# Patient Record
Sex: Female | Born: 1935 | Race: White | Hispanic: No | Smoking: Former smoker
Health system: Southern US, Community
[De-identification: ages and names within clinical notes are randomized; demographics above are authoritative.]

## PROBLEM LIST (undated history)

## (undated) DIAGNOSIS — T4145XA Adverse effect of unspecified anesthetic, initial encounter: Secondary | ICD-10-CM

## (undated) DIAGNOSIS — Z9889 Other specified postprocedural states: Secondary | ICD-10-CM

## (undated) DIAGNOSIS — Z87891 Personal history of nicotine dependence: Secondary | ICD-10-CM

## (undated) DIAGNOSIS — T7840XA Allergy, unspecified, initial encounter: Secondary | ICD-10-CM

## (undated) DIAGNOSIS — T8859XA Other complications of anesthesia, initial encounter: Secondary | ICD-10-CM

## (undated) DIAGNOSIS — M1990 Unspecified osteoarthritis, unspecified site: Secondary | ICD-10-CM

## (undated) DIAGNOSIS — R112 Nausea with vomiting, unspecified: Secondary | ICD-10-CM

## (undated) DIAGNOSIS — R519 Headache, unspecified: Secondary | ICD-10-CM

## (undated) DIAGNOSIS — I10 Essential (primary) hypertension: Secondary | ICD-10-CM

## (undated) DIAGNOSIS — Z1211 Encounter for screening for malignant neoplasm of colon: Secondary | ICD-10-CM

## (undated) DIAGNOSIS — Z87440 Personal history of urinary (tract) infections: Secondary | ICD-10-CM

## (undated) DIAGNOSIS — C801 Malignant (primary) neoplasm, unspecified: Secondary | ICD-10-CM

## (undated) DIAGNOSIS — Z1239 Encounter for other screening for malignant neoplasm of breast: Secondary | ICD-10-CM

## (undated) HISTORY — DX: Essential (primary) hypertension: I10

## (undated) HISTORY — DX: Personal history of nicotine dependence: Z87.891

## (undated) HISTORY — PX: COLONOSCOPY W/ POLYPECTOMY: SHX1380

## (undated) HISTORY — PX: ABDOMINAL HYSTERECTOMY: SHX81

## (undated) HISTORY — DX: Encounter for screening for malignant neoplasm of colon: Z12.11

## (undated) HISTORY — DX: Allergy, unspecified, initial encounter: T78.40XA

## (undated) HISTORY — DX: Encounter for other screening for malignant neoplasm of breast: Z12.39

## (undated) HISTORY — PX: JOINT REPLACEMENT: SHX530

## (undated) HISTORY — DX: Personal history of urinary (tract) infections: Z87.440

## (undated) HISTORY — PX: BELPHAROPTOSIS REPAIR: SHX369

## (undated) HISTORY — DX: Unspecified osteoarthritis, unspecified site: M19.90

---

## 2005-01-10 ENCOUNTER — Ambulatory Visit: Payer: Self-pay | Admitting: General Surgery

## 2005-01-13 ENCOUNTER — Ambulatory Visit: Payer: Self-pay | Admitting: General Surgery

## 2007-02-16 ENCOUNTER — Ambulatory Visit: Payer: Self-pay | Admitting: Unknown Physician Specialty

## 2007-02-16 ENCOUNTER — Other Ambulatory Visit: Payer: Self-pay

## 2007-02-20 ENCOUNTER — Inpatient Hospital Stay: Payer: Self-pay | Admitting: Unknown Physician Specialty

## 2007-03-15 ENCOUNTER — Ambulatory Visit: Payer: Self-pay | Admitting: Family Medicine

## 2010-04-04 DIAGNOSIS — Z87440 Personal history of urinary (tract) infections: Secondary | ICD-10-CM

## 2010-04-04 HISTORY — PX: HEMORRHOID SURGERY: SHX153

## 2010-04-04 HISTORY — DX: Personal history of urinary (tract) infections: Z87.440

## 2011-04-05 DIAGNOSIS — Z1211 Encounter for screening for malignant neoplasm of colon: Secondary | ICD-10-CM

## 2011-04-05 HISTORY — DX: Encounter for screening for malignant neoplasm of colon: Z12.11

## 2011-04-08 ENCOUNTER — Ambulatory Visit: Payer: Self-pay | Admitting: General Surgery

## 2011-04-11 LAB — PATHOLOGY REPORT

## 2011-05-20 ENCOUNTER — Ambulatory Visit: Payer: Self-pay | Admitting: Unknown Physician Specialty

## 2011-06-02 ENCOUNTER — Ambulatory Visit: Payer: Self-pay | Admitting: Unknown Physician Specialty

## 2011-06-02 DIAGNOSIS — I10 Essential (primary) hypertension: Secondary | ICD-10-CM

## 2011-06-02 LAB — URINALYSIS, COMPLETE
Bilirubin,UR: NEGATIVE
Blood: NEGATIVE
Ketone: NEGATIVE
Ph: 5 (ref 4.5–8.0)
Specific Gravity: 1.02 (ref 1.003–1.030)
Squamous Epithelial: 1
WBC UR: 2 /HPF (ref 0–5)

## 2011-06-02 LAB — CBC
HGB: 12.8 g/dL (ref 12.0–16.0)
MCH: 31.6 pg (ref 26.0–34.0)
MCV: 95 fL (ref 80–100)
RBC: 4.05 10*6/uL (ref 3.80–5.20)
RDW: 12.2 % (ref 11.5–14.5)
WBC: 6.2 10*3/uL (ref 3.6–11.0)

## 2011-06-06 ENCOUNTER — Ambulatory Visit: Payer: Self-pay | Admitting: Unknown Physician Specialty

## 2011-09-12 ENCOUNTER — Ambulatory Visit: Payer: Self-pay | Admitting: Unknown Physician Specialty

## 2012-10-17 ENCOUNTER — Encounter: Payer: Self-pay | Admitting: *Deleted

## 2013-04-24 ENCOUNTER — Ambulatory Visit: Payer: Self-pay | Admitting: Internal Medicine

## 2013-07-22 ENCOUNTER — Ambulatory Visit: Payer: Self-pay | Admitting: Internal Medicine

## 2014-02-20 ENCOUNTER — Ambulatory Visit: Payer: Self-pay | Admitting: Internal Medicine

## 2014-07-27 NOTE — Op Note (Signed)
PATIENT NAME:  Chloe Rhodes, Chloe Rhodes MR#:  [id number] DATE OF BIRTH:  [birth year]  DATE OF PROCEDURE:  06/06/2011  PREOPERATIVE DIAGNOSIS: Torn medial meniscus, right knee, with probable medial compartment chondral lesions.   POSTOPERATIVE DIAGNOSIS: Torn medial meniscus, right knee, with medial compartment chondral lesions.   PROCEDURE PERFORMED: Arthroscopic partial medial meniscectomy plus debridement and Coblation of the medial femoral chondral lesion.   SURGEON: Kathrene Alu., MD  ANESTHESIA: General.   HISTORY: The patient had a long history of right knee pain. Her plain films had actually been fairly normal. Ultimately a MRI was obtained. It revealed a torn medial meniscus and some degenerative changes referable to the medial compartment, right knee.   The patient was ultimately brought in for arthroscopy of the right knee because of persistent symptoms despite conservative treatment.   DESCRIPTION OF PROCEDURE: The patient was taken to the Operating Room where satisfactory general anesthesia was achieved.   A tourniquet and legholder were applied to the right thigh and the left lower extremity was supported with a well leg support. The right knee was prepped and draped in the usual fashion for a procedure about the knee.   An inflow cannula was introduced superomedially. A moderate amount of clear synovial fluid was obtained from the knee joint. The knee was distended with lactated Ringer's. We used the Mitek fluid pump to facilitate knee extension.   The scope was introduced through an inferolateral puncture wound and a probe was brought in through an inferomedial puncture wound. Inspection of the medial compartment revealed that the patient had a degenerative type tear of the posterior horn of the medial meniscus. There was grade III, almost grade IV chondral changes in the mid weight-bearing portion of the medial femoral condyle.   I went ahead and resected the torn medial meniscus  using a combination of motorized resector and basket biter. The remaining rim was contoured with an angled ArthroCare thermal wand. I debrided the medial femoral chondral lesion with a Turbo Whisker and then coblated the lesion with an ArthroCare Paragon wand. I then inspected the intercondylar notch. The anterior cruciate ligament appeared to be intact. Inspection of the lateral compartment revealed a little superficial fraying of the mid weight-bearing portion of the lateral femoral condyle and the midportion of the lateral tibial plateau. I used a Turbo Whisker to debride these frayed areas.   Inspection of the trochlear groove revealed that there were some mild degenerative changes. The retropatellar surface was viewed primarily from the superomedial portal. The retropatellar surface exhibited some grade II chondral changes with possibly grade III in a few areas. There was a superior patella osteophyte noted. I debrided this with a large round bur.   The joint was then decompressed through the cannulas. I removed the cannulas. The puncture wounds were closed with 3-0 nylon in vertical mattress fashion. Several milliliters of 0.5% Marcaine without epinephrine was injected about each puncture wound.   Betadine was applied to the wounds followed by a sterile dressing and an ice pack.   The patient was then awakened and transferred to her stretcher bed. She was taken to the Recovery Room in satisfactory condition. Blood loss was negligible.   Incidentally, the tourniquet was not inflated during the course of the procedure.  ____________________________ Kathrene Alu., MD hbk:slb D: 06/06/2011 16:46:54 ET    T: 06/06/2011 17:47:34 ET       JOB#: [id number] cc: Kathrene Alu., MD, <Dictator> Kathrene Alu MD  ELECTRONICALLY SIGNED 06/13/2011 15:29

## 2014-08-18 DIAGNOSIS — Z85828 Personal history of other malignant neoplasm of skin: Secondary | ICD-10-CM | POA: Insufficient documentation

## 2015-08-26 ENCOUNTER — Encounter: Payer: Self-pay | Admitting: *Deleted

## 2015-09-01 ENCOUNTER — Ambulatory Visit
Admission: RE | Admit: 2015-09-01 | Discharge: 2015-09-01 | Disposition: A | Discharge status: Home / Self Care | Payer: Medicare Other | Source: Ambulatory Visit | Attending: Ophthalmology | Admitting: Ophthalmology

## 2015-09-01 ENCOUNTER — Ambulatory Visit: Payer: Medicare Other | Admitting: Anesthesiology

## 2015-09-01 ENCOUNTER — Encounter
Admission: RE | Disposition: A | Discharge status: Home / Self Care | Payer: Self-pay | Source: Ambulatory Visit | Attending: Ophthalmology

## 2015-09-01 DIAGNOSIS — Z79899 Other long term (current) drug therapy: Secondary | ICD-10-CM | POA: Insufficient documentation

## 2015-09-01 DIAGNOSIS — E7800 Pure hypercholesterolemia, unspecified: Secondary | ICD-10-CM | POA: Insufficient documentation

## 2015-09-01 DIAGNOSIS — M1990 Unspecified osteoarthritis, unspecified site: Secondary | ICD-10-CM | POA: Insufficient documentation

## 2015-09-01 DIAGNOSIS — I10 Essential (primary) hypertension: Secondary | ICD-10-CM | POA: Diagnosis not present

## 2015-09-01 DIAGNOSIS — J449 Chronic obstructive pulmonary disease, unspecified: Secondary | ICD-10-CM | POA: Insufficient documentation

## 2015-09-01 DIAGNOSIS — Z96659 Presence of unspecified artificial knee joint: Secondary | ICD-10-CM | POA: Insufficient documentation

## 2015-09-01 DIAGNOSIS — Z87891 Personal history of nicotine dependence: Secondary | ICD-10-CM | POA: Diagnosis not present

## 2015-09-01 DIAGNOSIS — H2512 Age-related nuclear cataract, left eye: Secondary | ICD-10-CM | POA: Diagnosis present

## 2015-09-01 DIAGNOSIS — Z90710 Acquired absence of both cervix and uterus: Secondary | ICD-10-CM | POA: Diagnosis not present

## 2015-09-01 DIAGNOSIS — Z96649 Presence of unspecified artificial hip joint: Secondary | ICD-10-CM | POA: Diagnosis not present

## 2015-09-01 DIAGNOSIS — Z86010 Personal history of colonic polyps: Secondary | ICD-10-CM | POA: Insufficient documentation

## 2015-09-01 DIAGNOSIS — Z885 Allergy status to narcotic agent status: Secondary | ICD-10-CM | POA: Diagnosis not present

## 2015-09-01 HISTORY — DX: Adverse effect of unspecified anesthetic, initial encounter: T41.45XA

## 2015-09-01 HISTORY — PX: CATARACT EXTRACTION W/PHACO: SHX586

## 2015-09-01 HISTORY — DX: Nausea with vomiting, unspecified: R11.2

## 2015-09-01 HISTORY — DX: Nausea with vomiting, unspecified: Z98.890

## 2015-09-01 HISTORY — DX: Other complications of anesthesia, initial encounter: T88.59XA

## 2015-09-01 SURGERY — PHACOEMULSIFICATION, CATARACT, WITH IOL INSERTION
Anesthesia: Monitor Anesthesia Care | Site: Eye | Laterality: Left | Wound class: Clean

## 2015-09-01 MED ORDER — TETRACAINE HCL 0.5 % OP SOLN
1.0000 [drp] | Freq: Once | OPHTHALMIC | Status: AC
  Administered 2015-09-01: 1 [drp] via OPHTHALMIC

## 2015-09-01 MED ORDER — CEFUROXIME OPHTHALMIC INJECTION 1 MG/0.1 ML
INJECTION | OPHTHALMIC | Status: AC
  Filled 2015-09-01: qty 0.1

## 2015-09-01 MED ORDER — ARMC OPHTHALMIC DILATING GEL
1.0000 "application " | OPHTHALMIC | Status: AC | PRN
  Administered 2015-09-01 (×2): 1 via OPHTHALMIC

## 2015-09-01 MED ORDER — POVIDONE-IODINE 5 % OP SOLN
1.0000 "application " | Freq: Once | OPHTHALMIC | Status: AC
  Administered 2015-09-01: 1 via OPHTHALMIC

## 2015-09-01 MED ORDER — MOXIFLOXACIN HCL 0.5 % OP SOLN: 1.0000 [drp] | OPHTHALMIC | Status: DC | PRN

## 2015-09-01 MED ORDER — CEFUROXIME OPHTHALMIC INJECTION 1 MG/0.1 ML
INJECTION | OPHTHALMIC | Status: DC | PRN
  Administered 2015-09-01: 0.1 mL via INTRACAMERAL

## 2015-09-01 MED ORDER — FENTANYL CITRATE (PF) 100 MCG/2ML IJ SOLN
INTRAMUSCULAR | Status: DC | PRN
  Administered 2015-09-01: 25 ug via INTRAVENOUS

## 2015-09-01 MED ORDER — EPINEPHRINE HCL 1 MG/ML IJ SOLN
INTRAMUSCULAR | Status: AC
  Filled 2015-09-01: qty 1

## 2015-09-01 MED ORDER — TETRACAINE HCL 0.5 % OP SOLN
OPHTHALMIC | Status: AC
  Administered 2015-09-01: 1 [drp] via OPHTHALMIC
  Filled 2015-09-01: qty 2

## 2015-09-01 MED ORDER — POVIDONE-IODINE 5 % OP SOLN
OPHTHALMIC | Status: AC
  Administered 2015-09-01: 1 via OPHTHALMIC
  Filled 2015-09-01: qty 30

## 2015-09-01 MED ORDER — EPINEPHRINE HCL 1 MG/ML IJ SOLN
INTRAOCULAR | Status: DC | PRN
  Administered 2015-09-01: 09:00:00 via OPHTHALMIC

## 2015-09-01 MED ORDER — SODIUM CHLORIDE 0.9 % IV SOLN
INTRAVENOUS | Status: DC
  Administered 2015-09-01 (×2): via INTRAVENOUS

## 2015-09-01 MED ORDER — MOXIFLOXACIN HCL 0.5 % OP SOLN
OPHTHALMIC | Status: DC | PRN
  Administered 2015-09-01: 1 [drp] via OPHTHALMIC

## 2015-09-01 MED ORDER — CARBACHOL 0.01 % IO SOLN
INTRAOCULAR | Status: DC | PRN
  Administered 2015-09-01: 0.5 mL via INTRAOCULAR

## 2015-09-01 MED ORDER — NA CHONDROIT SULF-NA HYALURON 40-17 MG/ML IO SOLN
INTRAOCULAR | Status: DC | PRN
  Administered 2015-09-01: 1 mL via INTRAOCULAR

## 2015-09-01 MED ORDER — MOXIFLOXACIN HCL 0.5 % OP SOLN
OPHTHALMIC | Status: AC
  Filled 2015-09-01: qty 3

## 2015-09-01 MED ORDER — NA CHONDROIT SULF-NA HYALURON 40-17 MG/ML IO SOLN
INTRAOCULAR | Status: AC
  Filled 2015-09-01: qty 1

## 2015-09-01 MED ORDER — ONDANSETRON HCL 4 MG/2ML IJ SOLN
INTRAMUSCULAR | Status: DC | PRN
  Administered 2015-09-01: 4 mg via INTRAVENOUS

## 2015-09-01 SURGICAL SUPPLY — 21 items
CANNULA ANT/CHMB 27GA (MISCELLANEOUS) ×2 IMPLANT
CUP MEDICINE 2OZ PLAST GRAD ST (MISCELLANEOUS) ×2 IMPLANT
GLOVE BIO SURGEON STRL SZ8 (GLOVE) ×2 IMPLANT
GLOVE BIOGEL M 6.5 STRL (GLOVE) ×2 IMPLANT
GLOVE SURG LX 8.0 MICRO (GLOVE) ×1
GLOVE SURG LX STRL 8.0 MICRO (GLOVE) ×1 IMPLANT
GOWN STRL REUS W/ TWL LRG LVL3 (GOWN DISPOSABLE) ×2 IMPLANT
GOWN STRL REUS W/TWL LRG LVL3 (GOWN DISPOSABLE) ×2
LENS IOL TECNIS ITEC 20.5 (Intraocular Lens) ×2 IMPLANT
PACK CATARACT (MISCELLANEOUS) ×2 IMPLANT
PACK CATARACT BRASINGTON LX (MISCELLANEOUS) ×2 IMPLANT
PACK EYE AFTER SURG (MISCELLANEOUS) ×2 IMPLANT
SOL BSS BAG (MISCELLANEOUS) ×2
SOL PREP PVP 2OZ (MISCELLANEOUS) ×2
SOLUTION BSS BAG (MISCELLANEOUS) ×1 IMPLANT
SOLUTION PREP PVP 2OZ (MISCELLANEOUS) ×1 IMPLANT
SYR 3ML LL SCALE MARK (SYRINGE) ×2 IMPLANT
SYR 5ML LL (SYRINGE) ×2 IMPLANT
SYR TB 1ML 27GX1/2 LL (SYRINGE) ×2 IMPLANT
WATER STERILE IRR 1000ML POUR (IV SOLUTION) ×2 IMPLANT
WIPE NON LINTING 3.25X3.25 (MISCELLANEOUS) ×2 IMPLANT

## 2015-09-01 NOTE — Anesthesia Postprocedure Evaluation (Signed)
Anesthesia Post Note  Patient: Chloe Rhodes  Procedure(s) Performed: Procedure(s) (LRB): CATARACT EXTRACTION PHACO AND INTRAOCULAR LENS PLACEMENT (IOC) (Left)  Patient location during evaluation: PACU Anesthesia Type: MAC Level of consciousness: awake and alert Pain management: pain level controlled Vital Signs Assessment: post-procedure vital signs reviewed and stable Respiratory status: spontaneous breathing, nonlabored ventilation, respiratory function stable and patient connected to nasal cannula oxygen Cardiovascular status: stable and blood pressure returned to baseline Anesthetic complications: no    Last Vitals:  Filed Vitals:   09/01/15 0809 09/01/15 0943  BP: 125/65 125/51  Pulse: 84 71  Temp: 36.4 C 36.5 C  Resp: 16 16    Last Pain: There were no vitals filed for this visit.               Precious Haws Jabarri Stefanelli

## 2015-09-01 NOTE — Anesthesia Preprocedure Evaluation (Signed)
Anesthesia Evaluation  Patient identified by MRN, date of birth, ID band Patient awake    Reviewed: Allergy & Precautions, H&P , NPO status , Patient's Chart, lab work & pertinent test results  History of Anesthesia Complications (+) PONV and history of anesthetic complications  Airway Mallampati: III  TM Distance: <3 FB Neck ROM: limited    Dental  (+) Poor Dentition, Chipped, Missing, Upper Dentures, Lower Dentures   Pulmonary neg shortness of breath, COPD, former smoker,    Pulmonary exam normal breath sounds clear to auscultation       Cardiovascular Exercise Tolerance: Good hypertension, (-) angina(-) Past MI and (-) DOE Normal cardiovascular exam Rhythm:regular Rate:Normal     Neuro/Psych negative neurological ROS  negative psych ROS   GI/Hepatic negative GI ROS, Neg liver ROS, neg GERD  ,  Endo/Other  negative endocrine ROS  Renal/GU negative Renal ROS  negative genitourinary   Musculoskeletal  (+) Arthritis ,   Abdominal   Peds  Hematology negative hematology ROS (+)   Anesthesia Other Findings Past Medical History:   Hypertension                                                 Personal history of tobacco use, presenting ha*              Arthritis                                                    Allergy                                                        Comment:codeine   H/O cystitis                                    2012         Breast screening, unspecified                                Special screening for malignant neoplasms, col* 0000000         Complication of anesthesia                                   PONV (postoperative nausea and vomiting)                    Past Surgical History:   HEMORRHOID SURGERY                               2012           Comment:about 20 years ago   COLONOSCOPY W/ POLYPECTOMY                       2006,2013  Comment:Dr. Sankar-04/08/11-a few sessile polyps  were               found in the distal sigmoid colon,31mm in size;               a benign appearing sessile polyp(17mm) was found              in the rectum. Pathology-benign   ABDOMINAL HYSTERECTOMY                                        JOINT REPLACEMENT                                               Comment:total hip/ partial knee  BMI    Body Mass Index   22.87 kg/m 2      Reproductive/Obstetrics negative OB ROS                             Anesthesia Physical Anesthesia Plan  ASA: III  Anesthesia Plan: MAC   Post-op Pain Management:    Induction:   Airway Management Planned:   Additional Equipment:   Intra-op Plan:   Post-operative Plan:   Informed Consent: I have reviewed the patients History and Physical, chart, labs and discussed the procedure including the risks, benefits and alternatives for the proposed anesthesia with the patient or authorized representative who has indicated his/her understanding and acceptance.   Dental Advisory Given  Plan Discussed with: Anesthesiologist, CRNA and Surgeon  Anesthesia Plan Comments:         Anesthesia Quick Evaluation

## 2015-09-01 NOTE — Op Note (Signed)
PREOPERATIVE DIAGNOSIS:  Nuclear sclerotic cataract of the left eye.   POSTOPERATIVE DIAGNOSIS:  NUCLEAR SCLEROTIC CATARACT LEFT EYE   OPERATIVE PROCEDURE:  Procedure(s): CATARACT EXTRACTION PHACO AND INTRAOCULAR LENS PLACEMENT (IOC)   SURGEON:  Birder Robson, MD.   ANESTHESIA:   Anesthesiologist: Andria Frames, MD CRNA: Jenetta Downer, CRNA  1.      Managed anesthesia care. 2.      Topical tetracaine drops followed by 2% Xylocaine jelly applied in the preoperative holding area.   COMPLICATIONS:  None.   TECHNIQUE:   Stop and chop   DESCRIPTION OF PROCEDURE:  The patient was examined and consented in the preoperative holding area where the aforementioned topical anesthesia was applied to the left eye and then brought back to the Operating Room where the left eye was prepped and draped in the usual sterile ophthalmic fashion and a lid speculum was placed. A paracentesis was created with the side port blade and the anterior chamber was filled with viscoelastic. A near clear corneal incision was performed with the steel keratome. A continuous curvilinear capsulorrhexis was performed with a cystotome followed by the capsulorrhexis forceps. Hydrodissection and hydrodelineation were carried out with BSS on a blunt cannula. The lens was removed in a stop and chop  technique and the remaining cortical material was removed with the irrigation-aspiration handpiece. The capsular bag was inflated with viscoelastic and the Technis ZCB00 lens was placed in the capsular bag without complication. The remaining viscoelastic was removed from the eye with the irrigation-aspiration handpiece. The wounds were hydrated. The anterior chamber was flushed with Miostat and the eye was inflated to physiologic pressure. 0.1 mL of cefuroxime concentration 10 mg/mL was placed in the anterior chamber. The wounds were found to be water tight. The eye was dressed with Vigamox. The patient was given protective  glasses to wear throughout the day and a shield with which to sleep tonight. The patient was also given drops with which to begin a drop regimen today and will follow-up with me in one day.  Implant Name Type Inv. Item Serial No. Manufacturer Lot No. LRB No. Used  LENS IOL DIOP 20.5 - [id number] Intraocular Lens LENS IOL DIOP 20.5 [id number]   Left 1   Procedure(s) with comments: CATARACT EXTRACTION PHACO AND INTRAOCULAR LENS PLACEMENT (IOC) (Left) - [place] 00:44 AP% 19.5 CDE 8.69 Fluide pack lot # [id number]  Electronically signed: [place] 09/01/2015 9:38 AM

## 2015-09-01 NOTE — Transfer of Care (Signed)
Immediate Anesthesia Transfer of Care Note  Patient: Chloe Rhodes  Procedure(s) Performed: Procedure(s) with comments: CATARACT EXTRACTION PHACO AND INTRAOCULAR LENS PLACEMENT (IOC) (Left) - [place] 00:44 AP% 19.5 CDE 8.69 Fluide pack lot # [id number]  Patient Location: PACU  Anesthesia Type:MAC  Level of Consciousness: awake, alert  and oriented  Airway & Oxygen Therapy: Patient Spontanous Breathing  Post-op Assessment: Report given to RN  Post vital signs: Reviewed and stable  Last Vitals:  Filed Vitals:   09/01/15 0809  BP: 125/65  Pulse: 84  Temp: 36.4 C  Resp: 16    Last Pain: There were no vitals filed for this visit.       Complications: No apparent anesthesia complications

## 2015-09-01 NOTE — H&P (Signed)
  All labs reviewed. Abnormal studies sent to patients PCP when indicated.  Previous H&P reviewed, patient examined, there are NO CHANGES.  Chloe Rhodes LOUIS5/30/20179:14 AM

## 2015-09-01 NOTE — Discharge Instructions (Signed)
Eye Surgery Discharge Instructions  Expect mild scratchy sensation or mild soreness. DO NOT RUB YOUR EYE!  The day of surgery:  Minimal physical activity, but bed rest is not required  No reading, computer work, or close hand work  No bending, lifting, or straining.  May watch TV  For 24 hours:  No driving, legal decisions, or alcoholic beverages  Safety precautions  Eat anything you prefer: It is better to start with liquids, then soup then solid foods.  _____ Eye patch should be worn until postoperative exam tomorrow.  ____ Solar shield eyeglasses should be worn for comfort in the sunlight/patch while sleeping  Resume all regular medications including aspirin or Coumadin if these were discontinued prior to surgery. You may shower, bathe, shave, or wash your hair. Tylenol may be taken for mild discomfort.  Call your doctor if you experience significant pain, nausea, or vomiting, fever > 101 or other signs of infection. [phone] or [phone] Specific instructions:  Follow-up Information    Follow up with Tim Lair, MD On 09/02/2015.   Specialty:  Ophthalmology   Why:  9:00   Contact information:   [address] [place] [address] [phone]

## 2015-09-22 ENCOUNTER — Ambulatory Visit
Admission: RE | Admit: 2015-09-22 | Discharge: 2015-09-22 | Disposition: A | Discharge status: Home / Self Care | Payer: Medicare Other | Source: Ambulatory Visit | Attending: Ophthalmology | Admitting: Ophthalmology

## 2015-09-22 ENCOUNTER — Encounter: Payer: Self-pay | Admitting: *Deleted

## 2015-09-22 ENCOUNTER — Ambulatory Visit: Payer: Medicare Other | Admitting: Anesthesiology

## 2015-09-22 ENCOUNTER — Encounter
Admission: RE | Disposition: A | Discharge status: Home / Self Care | Payer: Self-pay | Source: Ambulatory Visit | Attending: Ophthalmology

## 2015-09-22 DIAGNOSIS — H2511 Age-related nuclear cataract, right eye: Secondary | ICD-10-CM | POA: Diagnosis not present

## 2015-09-22 HISTORY — PX: CATARACT EXTRACTION W/PHACO: SHX586

## 2015-09-22 SURGERY — PHACOEMULSIFICATION, CATARACT, WITH IOL INSERTION
Anesthesia: Monitor Anesthesia Care | Site: Eye | Laterality: Right | Wound class: Clean

## 2015-09-22 MED ORDER — SODIUM CHLORIDE 0.9 % IV SOLN
INTRAVENOUS | Status: DC
  Administered 2015-09-22: 75 mL/h via INTRAVENOUS

## 2015-09-22 MED ORDER — NA CHONDROIT SULF-NA HYALURON 40-17 MG/ML IO SOLN
INTRAOCULAR | Status: AC
Start: 2015-09-22 — End: 2015-09-22
  Filled 2015-09-22: qty 1

## 2015-09-22 MED ORDER — TETRACAINE HCL 0.5 % OP SOLN
1.0000 [drp] | Freq: Once | OPHTHALMIC | Status: AC
  Administered 2015-09-22: 1 [drp] via OPHTHALMIC

## 2015-09-22 MED ORDER — MOXIFLOXACIN HCL 0.5 % OP SOLN
OPHTHALMIC | Status: AC
  Filled 2015-09-22: qty 3

## 2015-09-22 MED ORDER — ARMC OPHTHALMIC DILATING GEL
1.0000 "application " | OPHTHALMIC | Status: DC | PRN
  Administered 2015-09-22: 1 via OPHTHALMIC

## 2015-09-22 MED ORDER — ONDANSETRON HCL 4 MG/2ML IJ SOLN: 4.0000 mg | Freq: Once | INTRAMUSCULAR | Status: DC | PRN

## 2015-09-22 MED ORDER — MOXIFLOXACIN HCL 0.5 % OP SOLN: 1.0000 [drp] | OPHTHALMIC | Status: DC | PRN

## 2015-09-22 MED ORDER — ARMC OPHTHALMIC DILATING GEL
OPHTHALMIC | Status: AC
  Administered 2015-09-22: 1 via OPHTHALMIC
  Filled 2015-09-22: qty 0.25

## 2015-09-22 MED ORDER — POVIDONE-IODINE 5 % OP SOLN
1.0000 "application " | Freq: Once | OPHTHALMIC | Status: AC
  Administered 2015-09-22: 1 via OPHTHALMIC

## 2015-09-22 MED ORDER — CEFUROXIME OPHTHALMIC INJECTION 1 MG/0.1 ML
INJECTION | OPHTHALMIC | Status: AC
  Filled 2015-09-22: qty 0.1

## 2015-09-22 MED ORDER — CEFUROXIME OPHTHALMIC INJECTION 1 MG/0.1 ML
INJECTION | OPHTHALMIC | Status: DC | PRN
  Administered 2015-09-22: .1 mL via INTRACAMERAL

## 2015-09-22 MED ORDER — TETRACAINE HCL 0.5 % OP SOLN
OPHTHALMIC | Status: AC
  Administered 2015-09-22: 1 [drp] via OPHTHALMIC
  Filled 2015-09-22: qty 2

## 2015-09-22 MED ORDER — POVIDONE-IODINE 5 % OP SOLN
OPHTHALMIC | Status: AC
  Administered 2015-09-22: 1 via OPHTHALMIC
  Filled 2015-09-22: qty 30

## 2015-09-22 MED ORDER — NA CHONDROIT SULF-NA HYALURON 40-17 MG/ML IO SOLN
INTRAOCULAR | Status: DC | PRN
  Administered 2015-09-22: 1 mL via INTRAOCULAR

## 2015-09-22 MED ORDER — MIDAZOLAM HCL 2 MG/2ML IJ SOLN
INTRAMUSCULAR | Status: DC | PRN
  Administered 2015-09-22: 1 mg via INTRAVENOUS
  Administered 2015-09-22: .5 mg via INTRAVENOUS
  Administered 2015-09-22: 1 mg via INTRAVENOUS

## 2015-09-22 MED ORDER — FENTANYL CITRATE (PF) 100 MCG/2ML IJ SOLN: 25.0000 ug | INTRAMUSCULAR | Status: DC | PRN

## 2015-09-22 MED ORDER — EPINEPHRINE HCL 1 MG/ML IJ SOLN
INTRAMUSCULAR | Status: AC
  Filled 2015-09-22: qty 1

## 2015-09-22 MED ORDER — CARBACHOL 0.01 % IO SOLN
INTRAOCULAR | Status: DC | PRN
Start: 2015-09-22 — End: 2015-09-22
  Administered 2015-09-22: 0.5 mL via INTRAOCULAR

## 2015-09-22 MED ORDER — MOXIFLOXACIN HCL 0.5 % OP SOLN
OPHTHALMIC | Status: DC | PRN
  Administered 2015-09-22: 1 [drp] via OPHTHALMIC

## 2015-09-22 MED ORDER — EPINEPHRINE HCL 1 MG/ML IJ SOLN
INTRAMUSCULAR | Status: DC | PRN
  Administered 2015-09-22: 1 mL via OPHTHALMIC

## 2015-09-22 SURGICAL SUPPLY — 21 items
CANNULA ANT/CHMB 27GA (MISCELLANEOUS) ×2 IMPLANT
CUP MEDICINE 2OZ PLAST GRAD ST (MISCELLANEOUS) ×2 IMPLANT
GLOVE BIO SURGEON STRL SZ8 (GLOVE) ×2 IMPLANT
GLOVE BIOGEL M 6.5 STRL (GLOVE) ×2 IMPLANT
GLOVE SURG LX 8.0 MICRO (GLOVE) ×1
GLOVE SURG LX STRL 8.0 MICRO (GLOVE) ×1 IMPLANT
GOWN STRL REUS W/ TWL LRG LVL3 (GOWN DISPOSABLE) ×2 IMPLANT
GOWN STRL REUS W/TWL LRG LVL3 (GOWN DISPOSABLE) ×2
LENS IOL TECNIS ITEC 21.0 (Intraocular Lens) ×2 IMPLANT
PACK CATARACT (MISCELLANEOUS) ×2 IMPLANT
PACK CATARACT BRASINGTON LX (MISCELLANEOUS) ×2 IMPLANT
PACK EYE AFTER SURG (MISCELLANEOUS) ×2 IMPLANT
SOL BSS BAG (MISCELLANEOUS) ×2
SOL PREP PVP 2OZ (MISCELLANEOUS) ×2
SOLUTION BSS BAG (MISCELLANEOUS) ×1 IMPLANT
SOLUTION PREP PVP 2OZ (MISCELLANEOUS) ×1 IMPLANT
SYR 3ML LL SCALE MARK (SYRINGE) ×2 IMPLANT
SYR 5ML LL (SYRINGE) ×2 IMPLANT
SYR TB 1ML 27GX1/2 LL (SYRINGE) ×2 IMPLANT
WATER STERILE IRR 1000ML POUR (IV SOLUTION) ×2 IMPLANT
WIPE NON LINTING 3.25X3.25 (MISCELLANEOUS) ×2 IMPLANT

## 2015-09-22 NOTE — Discharge Instructions (Signed)
Eye Surgery Discharge Instructions  Expect mild scratchy sensation or mild soreness. DO NOT RUB YOUR EYE!  The day of surgery:  Minimal physical activity, but bed rest is not required  No reading, computer work, or close hand work  No bending, lifting, or straining.  May watch TV  For 24 hours:  No driving, legal decisions, or alcoholic beverages  Safety precautions  Eat anything you prefer: It is better to start with liquids, then soup then solid foods.  _____ Eye patch should be worn until postoperative exam tomorrow.  ____ Solar shield eyeglasses should be worn for comfort in the sunlight/patch while sleeping  Resume all regular medications including aspirin or Coumadin if these were discontinued prior to surgery. You may shower, bathe, shave, or wash your hair. Tylenol may be taken for mild discomfort.  Call your doctor if you experience significant pain, nausea, or vomiting, fever > 101 or other signs of infection. [phone] or [phone] Specific instructions:  Follow-up Information    Follow up with Tim Lair, MD On 09/23/2015.   Specialty:  Ophthalmology   Why:  [address] information:   [address] [place] [address] [phone]

## 2015-09-22 NOTE — Anesthesia Postprocedure Evaluation (Signed)
Anesthesia Post Note  Patient: Chloe Rhodes  Procedure(s) Performed: Procedure(s) (LRB): CATARACT EXTRACTION PHACO AND INTRAOCULAR LENS PLACEMENT (IOC) (Right)  Patient location during evaluation: PACU Level of consciousness: awake and alert Pain management: pain level controlled Vital Signs Assessment: post-procedure vital signs reviewed and stable Respiratory status: spontaneous breathing Cardiovascular status: blood pressure returned to baseline and stable Postop Assessment: no headache and no backache Anesthetic complications: no    Last Vitals:  Filed Vitals:   09/22/15 0951  BP: 142/60  Pulse: 82  Temp: 35.7 C  Resp: 16    Last Pain:  Filed Vitals:   09/22/15 1006  PainSc: 0-No pain                 Adriella Essex C

## 2015-09-22 NOTE — Anesthesia Preprocedure Evaluation (Signed)
Anesthesia Evaluation  Patient identified by MRN, date of birth, ID band Patient awake    Reviewed: Allergy & Precautions, NPO status , Patient's Chart, lab work & pertinent test results  History of Anesthesia Complications (+) PONV  Airway Mallampati: II       Dental  (+) Upper Dentures, Lower Dentures   Pulmonary COPD, former smoker,    Pulmonary exam normal breath sounds clear to auscultation       Cardiovascular Exercise Tolerance: Good hypertension, Pt. on medications  Rhythm:Regular     Neuro/Psych negative neurological ROS     GI/Hepatic negative GI ROS, Neg liver ROS,   Endo/Other  negative endocrine ROS  Renal/GU negative Renal ROS     Musculoskeletal negative musculoskeletal ROS (+)   Abdominal Normal abdominal exam  (+)   Peds negative pediatric ROS (+)  Hematology negative hematology ROS (+)   Anesthesia Other Findings   Reproductive/Obstetrics                             Anesthesia Physical Anesthesia Plan  ASA: II  Anesthesia Plan: MAC   Post-op Pain Management:    Induction: Intravenous  Airway Management Planned: Natural Airway and Nasal Cannula  Additional Equipment:   Intra-op Plan:   Post-operative Plan:   Informed Consent: I have reviewed the patients History and Physical, chart, labs and discussed the procedure including the risks, benefits and alternatives for the proposed anesthesia with the patient or authorized representative who has indicated his/her understanding and acceptance.     Plan Discussed with: CRNA  Anesthesia Plan Comments:         Anesthesia Quick Evaluation

## 2015-09-22 NOTE — Transfer of Care (Signed)
Immediate Anesthesia Transfer of Care Note  Patient: Chloe Rhodes  Procedure(s) Performed: Procedure(s) with comments: CATARACT EXTRACTION PHACO AND INTRAOCULAR LENS PLACEMENT (IOC) (Right) - [place] 00:59 AP% 19.7 CDE 11.72 fluid pack lot # [id number]  Patient Location: PACU  Anesthesia Type:MAC  Level of Consciousness: awake  Airway & Oxygen Therapy: Patient Spontanous Breathing  Post-op Assessment: Report given to RN  Post vital signs: Reviewed and stable  Last Vitals:  Filed Vitals:   09/22/15 0951  BP: 142/60  Pulse: 82  Temp: 35.7 C  Resp: 16    Last Pain:  Filed Vitals:   09/22/15 1006  PainSc: 0-No pain         Complications: No apparent anesthesia complications

## 2015-09-22 NOTE — H&P (Signed)
  All labs reviewed. Abnormal studies sent to patients PCP when indicated.  Previous H&P reviewed, patient examined, there are NO CHANGES.  Chloe Rhodes

## 2015-09-22 NOTE — Op Note (Signed)
PREOPERATIVE DIAGNOSIS:  Nuclear sclerotic cataract of the right eye.   POSTOPERATIVE DIAGNOSIS: nuclear sclerotic cataract right eye   OPERATIVE PROCEDURE:  Procedure(s): CATARACT EXTRACTION PHACO AND INTRAOCULAR LENS PLACEMENT (IOC)   SURGEON:  Birder Robson, MD.   ANESTHESIA:  Anesthesiologist: Iver Nestle, MD CRNA: Jennette Bill  1.      Managed anesthesia care. 2.      Topical tetracaine drops followed by 2% Xylocaine jelly applied in the preoperative holding area.   COMPLICATIONS:  None.   TECHNIQUE:   Stop and chop   DESCRIPTION OF PROCEDURE:  The patient was examined and consented in the preoperative holding area where the aforementioned topical anesthesia was applied to the right eye and then brought back to the Operating Room where the right eye was prepped and draped in the usual sterile ophthalmic fashion and a lid speculum was placed. A paracentesis was created with the side port blade and the anterior chamber was filled with viscoelastic. A near clear corneal incision was performed with the steel keratome. A continuous curvilinear capsulorrhexis was performed with a cystotome followed by the capsulorrhexis forceps. Hydrodissection and hydrodelineation were carried out with BSS on a blunt cannula. The lens was removed in a stop and chop  technique and the remaining cortical material was removed with the irrigation-aspiration handpiece. The capsular bag was inflated with viscoelastic and the Technis [id number]  lens was placed in the capsular bag without complication. The remaining viscoelastic was removed from the eye with the irrigation-aspiration handpiece. The wounds were hydrated. The anterior chamber was flushed with Miostat and the eye was inflated to physiologic pressure. 0.1 mL of cefuroxime concentration 10 mg/mL was placed in the anterior chamber. The wounds were found to be water tight. The eye was dressed with Vigamox. The patient was given protective glasses  to wear throughout the day and a shield with which to sleep tonight. The patient was also given drops with which to begin a drop regimen today and will follow-up with me in one day.  Implant Name Type Inv. Item Serial No. Manufacturer Lot No. LRB No. Used  LENS IOL DIOP 21.0 - [id number] Intraocular Lens LENS IOL DIOP 21.0 [id number]   Right 1   Procedure(s) with comments: CATARACT EXTRACTION PHACO AND INTRAOCULAR LENS PLACEMENT (IOC) (Right) - [place] 00:59 AP% 19.7 CDE 11.72 fluid pack lot # [id number]  Electronically signed: [place] 09/22/2015 11:14 AM

## 2016-04-06 ENCOUNTER — Encounter: Payer: Self-pay | Admitting: *Deleted

## 2016-04-12 ENCOUNTER — Ambulatory Visit: Payer: Self-pay | Admitting: General Surgery

## 2016-05-17 ENCOUNTER — Ambulatory Visit (INDEPENDENT_AMBULATORY_CARE_PROVIDER_SITE_OTHER): Payer: Medicare Other | Admitting: General Surgery

## 2016-05-17 ENCOUNTER — Encounter: Payer: Self-pay | Admitting: General Surgery

## 2016-05-17 VITALS — BP 136/70 | HR 88 | Resp 16 | Ht 69.0 in | Wt 156.0 lb

## 2016-05-17 DIAGNOSIS — Z860100 Personal history of colon polyps, unspecified: Secondary | ICD-10-CM | POA: Insufficient documentation

## 2016-05-17 MED ORDER — BISACODYL 5 MG PO TBEC: 5.0000 mg | DELAYED_RELEASE_TABLET | Freq: Once | ORAL | 0 refills | Status: AC

## 2016-05-17 MED ORDER — POLYETHYLENE GLYCOL 3350 17 GM/SCOOP PO POWD: 1.0000 | Freq: Once | ORAL | 0 refills | Status: AC

## 2016-05-17 NOTE — Patient Instructions (Addendum)
Colonoscopy, Adult A colonoscopy is an exam to look at the entire large intestine. During the exam, a lubricated, bendable tube is inserted into the anus and then passed into the rectum, colon, and other parts of the large intestine. A colonoscopy is often done as a part of normal colorectal screening or in response to certain symptoms, such as anemia, persistent diarrhea, abdominal pain, and blood in the stool. The exam can help screen for and diagnose medical problems, including:  Tumors.  Polyps.  Inflammation.  Areas of bleeding. Tell a health care provider about:  Any allergies you have.  All medicines you are taking, including vitamins, herbs, eye drops, creams, and over-the-counter medicines.  Any problems you or family members have had with anesthetic medicines.  Any blood disorders you have.  Any surgeries you have had.  Any medical conditions you have.  Any problems you have had passing stool. What are the risks? Generally, this is a safe procedure. However, problems may occur, including:  Bleeding.  A tear in the intestine.  A reaction to medicines given during the exam.  Infection (rare). What happens before the procedure? Eating and drinking restrictions  Follow instructions from your health care provider about eating and drinking, which may include:  A few days before the procedure - follow a low-fiber diet. Avoid nuts, seeds, dried fruit, raw fruits, and vegetables.  1-3 days before the procedure - follow a clear liquid diet. Drink only clear liquids, such as clear broth or bouillon, black coffee or tea, clear juice, clear soft drinks or sports drinks, gelatin desert, and popsicles. Avoid any liquids that contain red or purple dye.  On the day of the procedure - do not eat or drink anything during the 2 hours before the procedure, or within the time period that your health care provider recommends. Bowel prep  If you were prescribed an oral bowel prep to  clean out your colon:  Take it as told by your health care provider. Starting the day before your procedure, you will need to drink a large amount of medicated liquid. The liquid will cause you to have multiple loose stools until your stool is almost clear or light green.  If your skin or anus gets irritated from diarrhea, you may use these to relieve the irritation:  Medicated wipes, such as adult wet wipes with aloe and vitamin E.  A skin soothing-product like petroleum jelly.  If you vomit while drinking the bowel prep, take a break for up to 60 minutes and then begin the bowel prep again. If vomiting continues and you cannot take the bowel prep without vomiting, call your health care provider. General instructions  Ask your health care provider about changing or stopping your regular medicines. This is especially important if you are taking diabetes medicines or blood thinners.  Plan to have someone take you home from the hospital or clinic. What happens during the procedure?  An IV tube may be inserted into one of your veins.  You will be given medicine to help you relax (sedative).  To reduce your risk of infection:  Your health care team will wash or sanitize their hands.  Your anal area will be washed with soap.  You will be asked to lie on your side with your knees bent.  Your health care provider will lubricate a long, thin, flexible tube. The tube will have a camera and a light on the end.  The tube will be inserted into your anus.  The tube will be gently eased through your rectum and colon.  Air will be delivered into your colon to keep it open. You may feel some pressure or cramping.  The camera will be used to take images during the procedure.  A small tissue sample may be removed from your body to be examined under a microscope (biopsy). If any potential problems are found, the tissue will be sent to a lab for testing.  If small polyps are found, your health  care provider may remove them and have them checked for cancer cells.  The tube that was inserted into your anus will be slowly removed. The procedure may vary among health care providers and hospitals. What happens after the procedure?  Your blood pressure, heart rate, breathing rate, and blood oxygen level will be monitored until the medicines you were given have worn off.  Do not drive for 24 hours after the exam.  You may have a small amount of blood in your stool.  You may pass gas and have mild abdominal cramping or bloating due to the air that was used to inflate your colon during the exam.  It is up to you to get the results of your procedure. Ask your health care provider, or the department performing the procedure, when your results will be ready. This information is not intended to replace advice given to you by your health care provider. Make sure you discuss any questions you have with your health care provider. Document Released: 03/18/2000 Document Revised: 10/09/2015 Document Reviewed: 06/02/2015 Elsevier Interactive Patient Education  2017 [employer].   The patient is scheduled for a Colonoscopy at [facility] on 06/01/16. They are aware to call the day before to get their arrival time. She will take 2 Dulcolax tabs the evening prior to her prep day. She will only take her blood pressure medication at 6 am with a sip of water the morning of. Miralax and Dulcolax prescriptions have been sent into the patient's pharmacy. The patient is aware of date and instructions.

## 2016-05-17 NOTE — Progress Notes (Addendum)
Patient ID: Chloe Rhodes, female   DOB: [birth year], 82 y.o.   MRN: [id number]  Chief Complaint  Patient presents with  . Colonoscopy    HPI Chloe Rhodes is a 82 y.o. female.  Who presents for a colonoscopy discussion. She has history of colon polyps.The last colonoscopy was completed on  04-08-11 . Denies any gastrointestinal issues. Bowels move regular and no bleeding noted. She remembers getting sick last time with the bowel prep. No new health issues. I have reviewed the history of present illness with the patient.  HPI  Past Medical History:  Diagnosis Date  . Allergy    codeine  . Arthritis   . Breast screening, unspecified   . Complication of anesthesia   . H/O cystitis 2012  . Hypertension   . Personal history of tobacco use, presenting hazards to health   . PONV (postoperative nausea and vomiting)   . Special screening for malignant neoplasms, colon 2013    Past Surgical History:  Procedure Laterality Date  . ABDOMINAL HYSTERECTOMY    . CATARACT EXTRACTION W/PHACO Left 09/01/2015   Procedure: CATARACT EXTRACTION PHACO AND INTRAOCULAR LENS PLACEMENT (IOC);  Surgeon: Birder Robson, MD;  Location: [facility] ORS;  Service: Ophthalmology;  Laterality: Left;  [place] 00:44 AP% 19.5 CDE 8.69 Fluide pack lot # [id number]  . CATARACT EXTRACTION W/PHACO Right 09/22/2015   Procedure: CATARACT EXTRACTION PHACO AND INTRAOCULAR LENS PLACEMENT (IOC);  Surgeon: Birder Robson, MD;  Location: [facility] ORS;  Service: Ophthalmology;  Laterality: Right;  [place] 00:59 AP% 19.7 CDE 11.72 fluid pack lot # [id number]  . COLONOSCOPY W/ POLYPECTOMY  [id number]   Dr. Sankar-04/08/11-a few sessile polyps were found in the distal sigmoid colon,49mm in size; a benign appearing sessile polyp(71mm) was found in the rectum. Pathology-benign  . [place]  2012   about 20 years ago  . JOINT REPLACEMENT Right [id number]   total hip/ partial knee both on right side    History reviewed. No pertinent family  history.  Social History Social History  Substance Use Topics  . Smoking status: Former Smoker    Packs/day: 1.00    Years: 30.00    Types: Cigarettes    Quit date: 04/04/2014  . Smokeless tobacco: Never Used  . Alcohol use No    Allergies  Allergen Reactions  . Codeine Nausea Only and Other (See Comments)    dizziness  . Morphine And Related     No current facility-administered medications for this visit.    No current outpatient prescriptions on file.   Facility-Administered Medications Ordered in Other Visits  Medication Dose Route Frequency Provider Last Rate Last Dose  . 0.9 %  sodium chloride infusion   Intravenous Continuous Christene Lye, MD 10 mL/hr at 06/01/16 1106 1,000 mL at 06/01/16 1106    Review of Systems Review of Systems  Constitutional: Negative.   Cardiovascular: Negative.   Gastrointestinal: Negative for abdominal pain, constipation and diarrhea.    Blood pressure 136/70, pulse 88, resp. rate 16, height 5\' 9"  (1.753 m), weight 156 lb (70.8 kg).  Physical Exam Physical Exam  Constitutional: She is oriented to person, place, and time. She appears well-developed and well-nourished.  HENT:  Mouth/Throat: Oropharynx is clear and moist.  Eyes: Conjunctivae are normal. No scleral icterus.  Neck: Neck supple.  Cardiovascular: Normal rate, regular rhythm and normal heart sounds.   Pulmonary/Chest: Effort normal and breath sounds normal.  Abdominal: Soft. Normal appearance and bowel sounds are normal. There is no hepatomegaly.  There is no tenderness. Hernia confirmed negative in the right inguinal area and confirmed negative in the left inguinal area.  Lymphadenopathy:    She has no cervical adenopathy.  Neurological: She is alert and oriented to person, place, and time.  Skin: Skin is warm and dry.  Psychiatric: Her behavior is normal.    Data Reviewed  Prior notes and colonoscopy.  Assessment    Stable physical exam. Past history of  colon polyps    Plan    Colonoscopy with possible biopsy/polypectomy prn: Information regarding the procedure, including its potential risks and complications (including but not limited to perforation of the bowel, which may require emergency surgery to repair, and bleeding) was verbally given to the patient. Educational information regarding lower intestinal endoscopy was given to the patient. Written instructions for how to complete the bowel prep using Miralax were provided. The importance of drinking ample fluids to avoid dehydration as a result of the prep emphasized.  The patient is scheduled for a Colonoscopy at [facility] on 06/01/16. They are aware to call the day before to get their arrival time. She will take 2 Dulcolax tabs the evening prior to her prep day. She will only take her blood pressure medication at 6 am with a sip of water the morning of. Miralax and Dulcolax prescriptions have been sent into the patient's pharmacy. The patient is aware of date and instructions.       This information has been scribed by Karie Fetch RN, BSN,BC.   SANKAR,SEEPLAPUTHUR G 06/01/2016, 11:56 AM

## 2016-06-01 ENCOUNTER — Ambulatory Visit: Payer: Medicare Other | Admitting: Anesthesiology

## 2016-06-01 ENCOUNTER — Ambulatory Visit
Admission: RE | Admit: 2016-06-01 | Discharge: 2016-06-01 | Disposition: A | Discharge status: Home / Self Care | Payer: Medicare Other | Source: Ambulatory Visit | Attending: General Surgery | Admitting: General Surgery

## 2016-06-01 ENCOUNTER — Encounter
Admission: RE | Disposition: A | Discharge status: Home / Self Care | Payer: Self-pay | Source: Ambulatory Visit | Attending: General Surgery

## 2016-06-01 ENCOUNTER — Encounter: Payer: Self-pay | Admitting: Anesthesiology

## 2016-06-01 DIAGNOSIS — Z79899 Other long term (current) drug therapy: Secondary | ICD-10-CM | POA: Insufficient documentation

## 2016-06-01 DIAGNOSIS — I10 Essential (primary) hypertension: Secondary | ICD-10-CM | POA: Diagnosis not present

## 2016-06-01 DIAGNOSIS — Z86010 Personal history of colonic polyps: Secondary | ICD-10-CM | POA: Diagnosis not present

## 2016-06-01 DIAGNOSIS — Z87891 Personal history of nicotine dependence: Secondary | ICD-10-CM | POA: Insufficient documentation

## 2016-06-01 DIAGNOSIS — K5730 Diverticulosis of large intestine without perforation or abscess without bleeding: Secondary | ICD-10-CM

## 2016-06-01 DIAGNOSIS — M1990 Unspecified osteoarthritis, unspecified site: Secondary | ICD-10-CM | POA: Diagnosis not present

## 2016-06-01 DIAGNOSIS — K621 Rectal polyp: Secondary | ICD-10-CM | POA: Insufficient documentation

## 2016-06-01 DIAGNOSIS — Z1211 Encounter for screening for malignant neoplasm of colon: Secondary | ICD-10-CM | POA: Insufficient documentation

## 2016-06-01 DIAGNOSIS — K219 Gastro-esophageal reflux disease without esophagitis: Secondary | ICD-10-CM | POA: Diagnosis not present

## 2016-06-01 HISTORY — PX: COLONOSCOPY WITH PROPOFOL: SHX5780

## 2016-06-01 SURGERY — COLONOSCOPY WITH PROPOFOL
Anesthesia: General

## 2016-06-01 MED ORDER — PROPOFOL 500 MG/50ML IV EMUL
INTRAVENOUS | Status: AC
  Filled 2016-06-01: qty 50

## 2016-06-01 MED ORDER — PROPOFOL 500 MG/50ML IV EMUL
INTRAVENOUS | Status: DC | PRN
Start: 2016-06-01 — End: 2016-06-01
  Administered 2016-06-01: 140 ug/kg/min via INTRAVENOUS

## 2016-06-01 MED ORDER — PROPOFOL 10 MG/ML IV BOLUS
INTRAVENOUS | Status: DC | PRN
  Administered 2016-06-01: 70 mg via INTRAVENOUS
  Administered 2016-06-01: 30 mg via INTRAVENOUS

## 2016-06-01 MED ORDER — LIDOCAINE HCL (PF) 1 % IJ SOLN
2.0000 mL | Freq: Once | INTRAMUSCULAR | Status: AC
  Administered 2016-06-01: 0.3 mL via INTRADERMAL
  Filled 2016-06-01: qty 2

## 2016-06-01 MED ORDER — SODIUM CHLORIDE 0.9 % IV SOLN
INTRAVENOUS | Status: DC
  Administered 2016-06-01: 1000 mL via INTRAVENOUS

## 2016-06-01 NOTE — Anesthesia Postprocedure Evaluation (Signed)
Anesthesia Post Note  Patient: Chloe Rhodes  Procedure(s) Performed: Procedure(s) (LRB): COLONOSCOPY WITH PROPOFOL (N/A)  Patient location during evaluation: Endoscopy Anesthesia Type: General Level of consciousness: awake and alert Pain management: pain level controlled Vital Signs Assessment: post-procedure vital signs reviewed and stable Respiratory status: spontaneous breathing, nonlabored ventilation, respiratory function stable and patient connected to nasal cannula oxygen Cardiovascular status: blood pressure returned to baseline and stable Postop Assessment: no signs of nausea or vomiting Anesthetic complications: no     Last Vitals:  Vitals:   06/01/16 1250 06/01/16 1300  BP: 98/68 119/72  Pulse: 72 (!) 57  Resp: 18 17  Temp:      Last Pain:  Vitals:   06/01/16 1231  TempSrc: Tympanic                 Precious Haws Ivah Girardot

## 2016-06-01 NOTE — Interval H&P Note (Signed)
History and Physical Interval Note:  06/01/2016 11:56 AM  Chloe Rhodes  has presented today for surgery, with the diagnosis of HX COLON POLYPS  The various methods of treatment have been discussed with the patient and family. After consideration of risks, benefits and other options for treatment, the patient has consented to  Procedure(s): COLONOSCOPY WITH PROPOFOL (N/A) as a surgical intervention .  The patient's history has been reviewed, patient examined, no change in status, stable for surgery.  I have reviewed the patient's chart and labs.  Questions were answered to the patient's satisfaction.     Luellen Howson G

## 2016-06-01 NOTE — Op Note (Signed)
[facility] Gastroenterology Patient Name: Chloe Rhodes Procedure Date: 06/01/2016 11:59 AM MRN: [id number] Account #: [id number] Date of Birth: July 23, 1934 Admit Type: Outpatient Age: 82 Room: [facility] ENDO ROOM 3 Gender: Female Note Status: Finalized Procedure:            Colonoscopy Indications:          High risk colon cancer surveillance: Personal history                        of colonic polyps Providers:            Jamesen Stahnke G. Jamal Collin, MD Referring MD:         Leona Carry. Hall Busing, MD (Referring MD) Medicines:            General Anesthesia Complications:        No immediate complications. Procedure:            Pre-Anesthesia Assessment:                       - General anesthesia under the supervision of an                        anesthesiologist was determined to be medically                        necessary for this procedure based on review of the                        patient's medical history, medications, and prior                        anesthesia history.                       After obtaining informed consent, the colonoscope was                        passed under direct vision. Throughout the procedure,                        the patient's blood pressure, pulse, and oxygen                        saturations were monitored continuously. The                        Colonoscope was introduced through the anus and                        advanced to the the cecum, identified by the ileocecal                        valve. The colonoscopy was performed without                        difficulty. The patient tolerated the procedure well.                        The quality of the bowel preparation was good. Findings:      The perianal and digital rectal examinations were normal.      A 5  mm polyp was found in the rectum. The polyp was sessile. The polyp       was removed with a cold biopsy forceps. Resection and retrieval were       complete.      Multiple  small-mouthed diverticula were found in the sigmoid colon.      The exam was otherwise without abnormality on direct and retroflexion       views. Impression:           - One 5 mm polyp in the rectum, removed with a cold                        biopsy forceps. Resected and retrieved.                       - Diverticulosis in the sigmoid colon.                       - The examination was otherwise normal on direct and                        retroflexion views. Recommendation:       - Discharge patient to home.                       - Resume previous diet.                       - Continue present medications.                       - Await pathology results.                       - Repeat colonoscopy in 5 years for surveillance. Procedure Code(s):    --- Professional ---                       [phone], Colonoscopy, flexible; with biopsy, single or                        multiple Diagnosis Code(s):    --- Professional ---                       Z86.010, Personal history of colonic polyps                       K62.1, Rectal polyp                       K57.30, Diverticulosis of large intestine without                        perforation or abscess without bleeding CPT copyright 2016 American Medical Association. All rights reserved. The codes documented in this report are preliminary and upon coder review may  be revised to meet current compliance requirements. Christene Lye, MD 06/01/2016 12:33:00 PM This report has been signed electronically. Number of Addenda: 0 Note Initiated On: 06/01/2016 11:59 AM Scope Withdrawal Time: 0 hours 7 minutes 54 seconds  Total Procedure Duration: 0 hours 27 minutes 36 seconds       [facility]

## 2016-06-01 NOTE — Interval H&P Note (Signed)
History and Physical Interval Note:  06/01/2016 11:09 AM  Chloe Rhodes  has presented today for surgery, with the diagnosis of HX COLON POLYPS  The various methods of treatment have been discussed with the patient and family. After consideration of risks, benefits and other options for treatment, the patient has consented to  Procedure(s): COLONOSCOPY WITH PROPOFOL (N/A) as a surgical intervention .  The patient's history has been reviewed, patient examined, no change in status, stable for surgery.  I have reviewed the patient's chart and labs.  Questions were answered to the patient's satisfaction.     [name]

## 2016-06-01 NOTE — H&P (View-Only) (Signed)
Patient ID: Chloe Rhodes, female   DOB: [birth year], 82 y.o.   MRN: [id number]  Chief Complaint  Patient presents with  . Colonoscopy    HPI Chloe Rhodes is a 82 y.o. female.  Who presents for a colonoscopy discussion. She has history of colon polyps.The last colonoscopy was completed on  04-08-11 . Denies any gastrointestinal issues. Bowels move regular and no bleeding noted. She remembers getting sick last time with the bowel prep. No new health issues. I have reviewed the history of present illness with the patient.  HPI  Past Medical History:  Diagnosis Date  . Allergy    codeine  . Arthritis   . Breast screening, unspecified   . Complication of anesthesia   . H/O cystitis 2012  . Hypertension   . Personal history of tobacco use, presenting hazards to health   . PONV (postoperative nausea and vomiting)   . Special screening for malignant neoplasms, colon 2013    Past Surgical History:  Procedure Laterality Date  . ABDOMINAL HYSTERECTOMY    . CATARACT EXTRACTION W/PHACO Left 09/01/2015   Procedure: CATARACT EXTRACTION PHACO AND INTRAOCULAR LENS PLACEMENT (IOC);  Surgeon: Birder Robson, MD;  Location: [facility] ORS;  Service: Ophthalmology;  Laterality: Left;  [place] 00:44 AP% 19.5 CDE 8.69 Fluide pack lot # [id number]  . CATARACT EXTRACTION W/PHACO Right 09/22/2015   Procedure: CATARACT EXTRACTION PHACO AND INTRAOCULAR LENS PLACEMENT (IOC);  Surgeon: Birder Robson, MD;  Location: [facility] ORS;  Service: Ophthalmology;  Laterality: Right;  [place] 00:59 AP% 19.7 CDE 11.72 fluid pack lot # [id number]  . COLONOSCOPY W/ POLYPECTOMY  [id number]   Dr. Sankar-04/08/11-a few sessile polyps were found in the distal sigmoid colon,33mm in size; a benign appearing sessile polyp(76mm) was found in the rectum. Pathology-benign  . [place]  2012   about 20 years ago  . JOINT REPLACEMENT Right [id number]   total hip/ partial knee both on right side    History reviewed. No pertinent family  history.  Social History Social History  Substance Use Topics  . Smoking status: Former Smoker    Packs/day: 1.00    Years: 30.00    Types: Cigarettes    Quit date: 04/04/2014  . Smokeless tobacco: Never Used  . Alcohol use No    Allergies  Allergen Reactions  . Codeine Nausea Only and Other (See Comments)    dizziness  . Morphine And Related     Current Outpatient Prescriptions  Medication Sig Dispense Refill  . amLODipine (NORVASC) 5 MG tablet Take 5 mg by mouth daily. Reported on 09/22/2015    . atorvastatin (LIPITOR) 20 MG tablet Take 20 mg by mouth daily at 6 PM. Reported on 09/22/2015    . benazepril (LOTENSIN) 10 MG tablet Take 10 mg by mouth daily. Reported on 09/22/2015    . hydrochlorothiazide (HYDRODIURIL) 25 MG tablet Take 25 mg by mouth daily. Reported on 09/22/2015     No current facility-administered medications for this visit.     Review of Systems Review of Systems  Constitutional: Negative.   Cardiovascular: Negative.   Gastrointestinal: Negative for abdominal pain, constipation and diarrhea.    Blood pressure 136/70, pulse 88, resp. rate 16, height 5\' 9"  (1.753 m), weight 156 lb (70.8 kg).  Physical Exam Physical Exam  Constitutional: She is oriented to person, place, and time. She appears well-developed and well-nourished.  HENT:  Mouth/Throat: Oropharynx is clear and moist.  Eyes: Conjunctivae are normal. No scleral icterus.  Neck: Neck supple.  Cardiovascular: Normal rate, regular rhythm and normal heart sounds.   Pulmonary/Chest: Effort normal and breath sounds normal.  Abdominal: Soft. Normal appearance and bowel sounds are normal. There is no hepatomegaly. There is no tenderness. Hernia confirmed negative in the right inguinal area and confirmed negative in the left inguinal area.  Lymphadenopathy:    She has no cervical adenopathy.  Neurological: She is alert and oriented to person, place, and time.  Skin: Skin is warm and dry.  Psychiatric:  Her behavior is normal.    Data Reviewed  Prior notes and colonoscopy.  Assessment    Stable physical exam. Past history of colon polyps    Plan    Colonoscopy with possible biopsy/polypectomy prn: Information regarding the procedure, including its potential risks and complications (including but not limited to perforation of the bowel, which may require emergency surgery to repair, and bleeding) was verbally given to the patient. Educational information regarding lower intestinal endoscopy was given to the patient. Written instructions for how to complete the bowel prep using Miralax were provided. The importance of drinking ample fluids to avoid dehydration as a result of the prep emphasized.  The patient is scheduled for a Colonoscopy at [facility] on 06/01/16. They are aware to call the day before to get their arrival time. She will take 2 Dulcolax tabs the evening prior to her prep day. She will only take her blood pressure medication at 6 am with a sip of water the morning of. Miralax and Dulcolax prescriptions have been sent into the patient's pharmacy. The patient is aware of date and instructions.       This information has been scribed by Karie Fetch RN, BSN,BC.   SANKAR,SEEPLAPUTHUR G 05/17/2016, 9:22 AM

## 2016-06-01 NOTE — Anesthesia Post-op Follow-up Note (Signed)
Anesthesia QCDR form completed.        

## 2016-06-01 NOTE — Transfer of Care (Signed)
Immediate Anesthesia Transfer of Care Note  Patient: Chloe Rhodes  Procedure(s) Performed: Procedure(s): COLONOSCOPY WITH PROPOFOL (N/A)  Patient Location: PACU and Endoscopy Unit  Anesthesia Type:General  Level of Consciousness: sedated  Airway & Oxygen Therapy: Patient Spontanous Breathing and Patient connected to nasal cannula oxygen  Post-op Assessment: Report given to RN and Post -op Vital signs reviewed and stable  Post vital signs: Reviewed and stable  Last Vitals:  Vitals:   06/01/16 1043 06/01/16 1231  BP: 131/64 (!) 91/54  Pulse: 79 64  Resp: 17 18  Temp: (!) 36.1 C     Last Pain:  Vitals:   06/01/16 1043  TempSrc: Tympanic         Complications: No apparent anesthesia complications

## 2016-06-01 NOTE — Anesthesia Preprocedure Evaluation (Signed)
Anesthesia Evaluation  Patient identified by MRN, date of birth, ID band Patient awake    Reviewed: Allergy & Precautions, H&P , NPO status , Patient's Chart, lab work & pertinent test results  History of Anesthesia Complications (+) PONV and history of anesthetic complications  Airway Mallampati: III  TM Distance: >3 FB Neck ROM: limited    Dental  (+) Poor Dentition, Missing, Upper Dentures, Lower Dentures   Pulmonary neg shortness of breath, COPD, former smoker,    Pulmonary exam normal breath sounds clear to auscultation       Cardiovascular Exercise Tolerance: Good hypertension, (-) angina(-) Past MI and (-) DOE Normal cardiovascular exam Rhythm:regular Rate:Normal     Neuro/Psych negative neurological ROS  negative psych ROS   GI/Hepatic negative GI ROS, Neg liver ROS, neg GERD  ,  Endo/Other  negative endocrine ROS  Renal/GU negative Renal ROS  negative genitourinary   Musculoskeletal  (+) Arthritis ,   Abdominal   Peds  Hematology negative hematology ROS (+)   Anesthesia Other Findings Past Medical History: No date: Allergy     Comment: codeine No date: Arthritis No date: Breast screening, unspecified No date: Complication of anesthesia 2012: H/O cystitis No date: Hypertension No date: Personal history of tobacco use, presenting ha* No date: PONV (postoperative nausea and vomiting) 2013: Special screening for malignant neoplasms, col*  Past Surgical History: No date: ABDOMINAL HYSTERECTOMY 09/01/2015: CATARACT EXTRACTION W/PHACO Left     Comment: Procedure: CATARACT EXTRACTION PHACO AND               INTRAOCULAR LENS PLACEMENT ([place]);  Surgeon:               Birder Robson, MD;  Location: [facility] OR[facility];                Service: Ophthalmology;  Laterality: Left;  [place]               00:44 AP% 19.5 CDE 8.69 Fluide pack lot #               [id number] 09/22/2015: CATARACT EXTRACTION W/PHACO Right  Comment: Procedure: CATARACT EXTRACTION PHACO AND               INTRAOCULAR LENS PLACEMENT ([facility]);  Surgeon:               Birder Robson, MD;  Location: [facility];                Service: Ophthalmology;  Laterality: Right;  [place]              00:59 AP% 19.7 CDE 11.72 fluid pack lot #               [id number]: COLONOSCOPY W/ POLYPECTOMY     Comment: Dr. Sankar-04/08/11-a few sessile polyps were               found in the distal sigmoid colon,68mm in size;               a benign appearing sessile polyp(6mm) was found              in the rectum. Pathology-benign 2012: HEMORRHOID SURGERY     Comment: about 20 years ago 2008,2014: JOINT REPLACEMENT Right     Comment: total hip/ partial knee both on right side  BMI    Body Mass Index:  23.33 kg/m      Reproductive/Obstetrics negative OB ROS  Anesthesia Physical Anesthesia Plan  ASA: III  Anesthesia Plan: General   Post-op Pain Management:    Induction:   Airway Management Planned:   Additional Equipment:   Intra-op Plan:   Post-operative Plan:   Informed Consent: I have reviewed the patients History and Physical, chart, labs and discussed the procedure including the risks, benefits and alternatives for the proposed anesthesia with the patient or authorized representative who has indicated his/her understanding and acceptance.   Dental Advisory Given  Plan Discussed with: Anesthesiologist, CRNA and Surgeon  Anesthesia Plan Comments:         Anesthesia Quick Evaluation

## 2016-06-01 NOTE — H&P (View-Only) (Signed)
Patient ID: Chloe Rhodes, female   DOB: 1934-09-26, 82 y.o.   MRN: [id number]  Chief Complaint  Patient presents with  . Colonoscopy    HPI Chloe Rhodes is a 82 y.o. female.  Who presents for a colonoscopy discussion. She has history of colon polyps.The last colonoscopy was completed on  04-08-11 . Denies any gastrointestinal issues. Bowels move regular and no bleeding noted. She remembers getting sick last time with the bowel prep. No new health issues. I have reviewed the history of present illness with the patient.  HPI  Past Medical History:  Diagnosis Date  . Allergy    codeine  . Arthritis   . Breast screening, unspecified   . Complication of anesthesia   . H/O cystitis 2012  . Hypertension   . Personal history of tobacco use, presenting hazards to health   . PONV (postoperative nausea and vomiting)   . Special screening for malignant neoplasms, colon 2013    Past Surgical History:  Procedure Laterality Date  . ABDOMINAL HYSTERECTOMY    . CATARACT EXTRACTION W/PHACO Left 09/01/2015   Procedure: CATARACT EXTRACTION PHACO AND INTRAOCULAR LENS PLACEMENT ([facility]);  Surgeon: Birder Robson, MD;  Location: [facility];  Service: Ophthalmology;  Laterality: Left;  [place] 00:44 AP% 19.5 CDE 8.69 Fluide pack lot # [id number]  . CATARACT EXTRACTION W/PHACO Right 09/22/2015   Procedure: CATARACT EXTRACTION PHACO AND INTRAOCULAR LENS PLACEMENT ([facility]);  Surgeon: Birder Robson, MD;  Location: [facility];  Service: Ophthalmology;  Laterality: Right;  [place] 00:59 AP% 19.7 CDE 11.72 fluid pack lot # [id number]  . COLONOSCOPY W/ POLYPECTOMY  [id number]   Dr. Sankar-04/08/11-a few sessile polyps were found in the distal sigmoid colon,28mm in size; a benign appearing sessile polyp(14mm) was found in the rectum. Pathology-benign  . [place]  2012   about 20 years ago  . JOINT REPLACEMENT Right [id number]   total hip/ partial knee both on right side    History reviewed. No pertinent family  history.  Social History Social History  Substance Use Topics  . Smoking status: Former Smoker    Packs/day: 1.00    Years: 30.00    Types: Cigarettes    Quit date: 04/04/2014  . Smokeless tobacco: Never Used  . Alcohol use No    Allergies  Allergen Reactions  . Codeine Nausea Only and Other (See Comments)    dizziness  . Morphine And Related     No current facility-administered medications for this visit.    No current outpatient prescriptions on file.   Facility-Administered Medications Ordered in Other Visits  Medication Dose Route Frequency Provider Last Rate Last Dose  . 0.9 %  sodium chloride infusion   Intravenous Continuous Christene Lye, MD 10 mL/hr at 06/01/16 1106 1,000 mL at 06/01/16 1106    Review of Systems Review of Systems  Constitutional: Negative.   Cardiovascular: Negative.   Gastrointestinal: Negative for abdominal pain, constipation and diarrhea.    Blood pressure 136/70, pulse 88, resp. rate 16, height 5\' 9"  (1.753 m), weight 156 lb (70.8 kg).  Physical Exam Physical Exam  Constitutional: She is oriented to person, place, and time. She appears well-developed and well-nourished.  HENT:  Mouth/Throat: Oropharynx is clear and moist.  Eyes: Conjunctivae are normal. No scleral icterus.  Neck: Neck supple.  Cardiovascular: Normal rate, regular rhythm and normal heart sounds.   Pulmonary/Chest: Effort normal and breath sounds normal.  Abdominal: Soft. Normal appearance and bowel sounds are normal. There is no hepatomegaly.  There is no tenderness. Hernia confirmed negative in the right inguinal area and confirmed negative in the left inguinal area.  Lymphadenopathy:    She has no cervical adenopathy.  Neurological: She is alert and oriented to person, place, and time.  Skin: Skin is warm and dry.  Psychiatric: Her behavior is normal.    Data Reviewed  Prior notes and colonoscopy.  Assessment    Stable physical exam. Past history of  colon polyps    Plan    Colonoscopy with possible biopsy/polypectomy prn: Information regarding the procedure, including its potential risks and complications (including but not limited to perforation of the bowel, which may require emergency surgery to repair, and bleeding) was verbally given to the patient. Educational information regarding lower intestinal endoscopy was given to the patient. Written instructions for how to complete the bowel prep using Miralax were provided. The importance of drinking ample fluids to avoid dehydration as a result of the prep emphasized.  The patient is scheduled for a Colonoscopy at [facility] on 06/01/16. They are aware to call the day before to get their arrival time. She will take 2 Dulcolax tabs the evening prior to her prep day. She will only take her blood pressure medication at 6 am with a sip of water the morning of. Miralax and Dulcolax prescriptions have been sent into the patient's pharmacy. The patient is aware of date and instructions.       This information has been scribed by Karie Fetch RN, BSN,BC.   SANKAR,SEEPLAPUTHUR G 06/01/2016, 11:56 AM

## 2016-06-02 ENCOUNTER — Encounter: Payer: Self-pay | Admitting: General Surgery

## 2016-06-02 LAB — SURGICAL PATHOLOGY

## 2016-06-28 ENCOUNTER — Encounter: Payer: Self-pay | Admitting: General Surgery

## 2017-03-20 ENCOUNTER — Other Ambulatory Visit: Payer: Self-pay | Admitting: Internal Medicine

## 2017-03-20 DIAGNOSIS — R109 Unspecified abdominal pain: Secondary | ICD-10-CM

## 2017-03-22 ENCOUNTER — Ambulatory Visit
Admission: RE | Admit: 2017-03-22 | Discharge: 2017-03-22 | Disposition: A | Discharge status: Home / Self Care | Payer: Medicare Other | Source: Ambulatory Visit | Attending: Internal Medicine | Admitting: Internal Medicine

## 2017-03-22 DIAGNOSIS — N281 Cyst of kidney, acquired: Secondary | ICD-10-CM | POA: Diagnosis not present

## 2017-03-22 DIAGNOSIS — R109 Unspecified abdominal pain: Secondary | ICD-10-CM

## 2017-09-27 ENCOUNTER — Ambulatory Visit
Admission: RE | Admit: 2017-09-27 | Discharge: 2017-09-27 | Disposition: A | Discharge status: Home / Self Care | Payer: Medicare Other | Source: Ambulatory Visit | Attending: Internal Medicine | Admitting: Internal Medicine

## 2017-09-27 ENCOUNTER — Other Ambulatory Visit: Payer: Self-pay | Admitting: Internal Medicine

## 2017-09-27 DIAGNOSIS — M25511 Pain in right shoulder: Secondary | ICD-10-CM

## 2017-09-27 DIAGNOSIS — I700 Atherosclerosis of aorta: Secondary | ICD-10-CM | POA: Insufficient documentation

## 2017-09-27 DIAGNOSIS — Z87891 Personal history of nicotine dependence: Secondary | ICD-10-CM | POA: Insufficient documentation

## 2017-09-27 DIAGNOSIS — R059 Cough, unspecified: Secondary | ICD-10-CM

## 2017-09-27 DIAGNOSIS — R05 Cough: Secondary | ICD-10-CM | POA: Diagnosis present

## 2017-09-27 DIAGNOSIS — J984 Other disorders of lung: Secondary | ICD-10-CM | POA: Diagnosis not present

## 2018-01-13 ENCOUNTER — Emergency Department: Payer: Medicare Other

## 2018-01-13 ENCOUNTER — Other Ambulatory Visit: Payer: Self-pay

## 2018-01-13 ENCOUNTER — Encounter: Payer: Self-pay | Admitting: Emergency Medicine

## 2018-01-13 ENCOUNTER — Emergency Department
Admission: EM | Admit: 2018-01-13 | Discharge: 2018-01-13 | Disposition: A | Discharge status: Home / Self Care | Payer: Medicare Other | Attending: Emergency Medicine | Admitting: Emergency Medicine

## 2018-01-13 DIAGNOSIS — Z87891 Personal history of nicotine dependence: Secondary | ICD-10-CM | POA: Insufficient documentation

## 2018-01-13 DIAGNOSIS — S59911A Unspecified injury of right forearm, initial encounter: Secondary | ICD-10-CM | POA: Diagnosis present

## 2018-01-13 DIAGNOSIS — Y929 Unspecified place or not applicable: Secondary | ICD-10-CM | POA: Diagnosis not present

## 2018-01-13 DIAGNOSIS — I10 Essential (primary) hypertension: Secondary | ICD-10-CM | POA: Diagnosis not present

## 2018-01-13 DIAGNOSIS — Z23 Encounter for immunization: Secondary | ICD-10-CM | POA: Insufficient documentation

## 2018-01-13 DIAGNOSIS — Y9389 Activity, other specified: Secondary | ICD-10-CM | POA: Insufficient documentation

## 2018-01-13 DIAGNOSIS — S50811A Abrasion of right forearm, initial encounter: Secondary | ICD-10-CM

## 2018-01-13 DIAGNOSIS — Y999 Unspecified external cause status: Secondary | ICD-10-CM | POA: Diagnosis not present

## 2018-01-13 DIAGNOSIS — Z79899 Other long term (current) drug therapy: Secondary | ICD-10-CM | POA: Diagnosis not present

## 2018-01-13 DIAGNOSIS — W548XXA Other contact with dog, initial encounter: Secondary | ICD-10-CM | POA: Diagnosis not present

## 2018-01-13 MED ORDER — TETANUS-DIPHTH-ACELL PERTUSSIS 5-2.5-18.5 LF-MCG/0.5 IM SUSP
0.5000 mL | Freq: Once | INTRAMUSCULAR | Status: AC
  Administered 2018-01-13: 0.5 mL via INTRAMUSCULAR
  Filled 2018-01-13: qty 0.5

## 2018-01-13 NOTE — ED Provider Notes (Signed)
[facility] Emergency Department Provider Note ____________________________________________  Time seen: 1541  I have reviewed the triage vital signs and the nursing notes.  HISTORY  Chief Complaint  Abrasion  HPI Chloe Rhodes is a 83 y.o. female presents to the ED for evaluation of injury sustained after a mechanical fall. She recalls walking her dog, when she got caught in his leash, she fell scraping her right forearm and knee. She denies any head injury, LOC, or chest pain. She presents with scabbing to the forearm and knee. She has been dressing the wounds with triple antibiotic ointment and A&D ointment. She was worried about infection due to local redness. She denies any fevers, chills, or purulent drainage. She reports an out-of-date tetanus status.  Past Medical History:  Diagnosis Date  . Allergy    codeine  . Arthritis   . Breast screening, unspecified   . Complication of anesthesia   . H/O cystitis 2012  . Hypertension   . Personal history of tobacco use, presenting hazards to health   . PONV (postoperative nausea and vomiting)   . Special screening for malignant neoplasms, colon 2013    Patient Active Problem List   Diagnosis Date Noted  . History of colonic polyps 05/17/2016    Past Surgical History:  Procedure Laterality Date  . ABDOMINAL HYSTERECTOMY    . CATARACT EXTRACTION W/PHACO Left 09/01/2015   Procedure: CATARACT EXTRACTION PHACO AND INTRAOCULAR LENS PLACEMENT ([facility]);  Surgeon: Birder Robson, MD;  Location: [facility] OR[facility];  Service: Ophthalmology;  Laterality: Left;  [place] 00:44 AP% 19.5 CDE 8.69 Fluide pack lot # [id number]  . CATARACT EXTRACTION W/PHACO Right 09/22/2015   Procedure: CATARACT EXTRACTION PHACO AND INTRAOCULAR LENS PLACEMENT ([facility]);  Surgeon: Birder Robson, MD;  Location: [facility] OR[facility];  Service: Ophthalmology;  Laterality: Right;  [place] 00:59 AP% 19.7 CDE 11.72 fluid pack lot # [id number]  . COLONOSCOPY W/ POLYPECTOMY  [id number]    Dr. Sankar-04/08/11-a few sessile polyps were found in the distal sigmoid colon,46mm in size; a benign appearing sessile polyp(69mm) was found in the rectum. Pathology-benign  . COLONOSCOPY WITH PROPOFOL N/A 06/01/2016   Procedure: COLONOSCOPY WITH PROPOFOL;  Surgeon: Christene Lye, MD;  Location: [facility] END[facility];  Service: Endoscopy;  Laterality: N/A;  . HEMORRHOID SURGERY  2012   about 20 years ago  . JOINT REPLACEMENT Right 2008,2014   total hip/ partial knee both on right side    Prior to Admission medications   Medication Sig Start Date End Date Taking? Authorizing Provider  amLODipine (NORVASC) 5 MG tablet Take 5 mg by mouth daily. Reported on 09/22/2015    [provider]  atorvastatin (LIPITOR) 20 MG tablet Take 20 mg by mouth daily at 6 PM. Reported on 09/22/2015    [provider]  benazepril (LOTENSIN) 10 MG tablet Take 10 mg by mouth daily. Reported on 09/22/2015    [provider]  hydrochlorothiazide (HYDRODIURIL) 25 MG tablet Take 25 mg by mouth daily. Reported on 09/22/2015    [provider]    Allergies Codeine and Morphine and related  History reviewed. No pertinent family history.  Social History Social History   Tobacco Use  . Smoking status: Former Smoker    Packs/day: 1.00    Years: 30.00    Pack years: 30.00    Types: Cigarettes    Last attempt to quit: 04/04/2014    Years since quitting: 3.7  . Smokeless tobacco: Never Used  Substance Use Topics  . Alcohol use:  No  . Drug use: No    Review of Systems  Constitutional: Negative for fever. Cardiovascular: Negative for chest pain. Respiratory: Negative for shortness of breath. Musculoskeletal: Negative for back pain. Right elbow pain Skin: Negative for rash. Skin abrasions as above Neurological: Negative for headaches, focal weakness or numbness. ____________________________________________  PHYSICAL EXAM:  VITAL SIGNS: ED Triage Vitals  Enc Vitals Group      BP 01/13/18 1450 124/69     Pulse Rate 01/13/18 1450 94     Resp 01/13/18 1450 12     Temp 01/13/18 1450 98.3 F (36.8 C)     Temp Source 01/13/18 1450 Oral     SpO2 01/13/18 1450 97 %     Weight 01/13/18 1450 145 lb (65.8 kg)     Height 01/13/18 1450 5\' 8"  (1.727 m)     Head Circumference --      Peak Flow --      Pain Score 01/13/18 1456 7     Pain Loc --      Pain Edu? --      Excl. in [place]? --     Constitutional: Alert and oriented. Well appearing and in no distress. Head: Normocephalic and atraumatic. Eyes: Conjunctivae are normal. Normal extraocular movements Neck: Supple. Normal ROM  Cardiovascular: Normal rate, regular rhythm. Normal distal pulses. Respiratory: Normal respiratory effort. No wheezes/rales/rhonchi. Musculoskeletal: right elbow with no obvious deformity. Normal elbow ROM without difficulty. Nontender with normal range of motion in all extremities.  Neurologic:  Normal gait without ataxia. Normal speech and language. No gross focal neurologic deficits are appreciated. Skin:  Skin is warm, dry and intact. No rash noted. Large skin abrasion with to the lateral proximal forearm. Eschar and dried skin noted to the wound bed.  ____________________________________________   RADIOLOGY  Right Elbow IMPRESSION: Negative. ____________________________________________  PROCEDURES  Procedures  Tdap 0.5 ml IM Wound care Wound debridement with soap-soaked gauze Non-adherent dressings applied ____________________________________________  INITIAL IMPRESSION / ASSESSMENT AND PLAN / ED COURSE  Geriatric patient with ED evaluation of injury sustained following mechanical fall.  Patient fell 4 days prior, has been dressing her large skin abrasion with topical antibiotic ointment.  She was also concerned for signs of infection due to pain at the elbow area she is reassured by her negative x-ray of the elbow.  Her superficial wound was cleansed here and debridement was  performed using soap soaked gauze.  The wound beds are clean and the edges are without any induration.  No signs of any local wound infection.  Patient is discharged with wound care instructions, including non-stick gauze, tube gauze, and Kerlix.  She is advised to keep the wound clean, dry, and covered as demonstrated.  She will follow with primary provider for ongoing symptom management. ____________________________________________  FINAL CLINICAL IMPRESSION(S) / ED DIAGNOSES  Final diagnoses:  Forearm abrasion, right, initial encounter      Melvenia Needles, PA-C 01/13/18 1946    Schuyler Amor, MD 01/14/18 0008

## 2018-01-13 NOTE — Discharge Instructions (Addendum)
Your exam and x-ray are essentially normal following your fall. You have some superficial scabbing noted, without infection. Keep the wound clean and covered. Change the dressing daily, as needed.

## 2018-01-13 NOTE — ED Triage Notes (Signed)
Dog pulled her down while walking 4 days ago. Abrasions noted R forearm and R knee. R shoulder pain.

## 2018-09-07 DIAGNOSIS — Z96651 Presence of right artificial knee joint: Secondary | ICD-10-CM | POA: Insufficient documentation

## 2018-09-07 DIAGNOSIS — M25561 Pain in right knee: Secondary | ICD-10-CM | POA: Insufficient documentation

## 2018-12-05 DIAGNOSIS — M75111 Incomplete rotator cuff tear or rupture of right shoulder, not specified as traumatic: Secondary | ICD-10-CM | POA: Insufficient documentation

## 2018-12-05 DIAGNOSIS — M7581 Other shoulder lesions, right shoulder: Secondary | ICD-10-CM | POA: Insufficient documentation

## 2018-12-28 ENCOUNTER — Other Ambulatory Visit: Payer: Self-pay | Admitting: Surgery

## 2018-12-28 DIAGNOSIS — M7581 Other shoulder lesions, right shoulder: Secondary | ICD-10-CM

## 2018-12-28 DIAGNOSIS — M75121 Complete rotator cuff tear or rupture of right shoulder, not specified as traumatic: Secondary | ICD-10-CM

## 2019-01-10 ENCOUNTER — Ambulatory Visit
Admission: RE | Admit: 2019-01-10 | Discharge: 2019-01-10 | Disposition: A | Discharge status: Home / Self Care | Payer: Medicare Other | Source: Ambulatory Visit | Attending: Surgery | Admitting: Surgery

## 2019-01-10 ENCOUNTER — Other Ambulatory Visit: Payer: Self-pay

## 2019-01-10 DIAGNOSIS — M75121 Complete rotator cuff tear or rupture of right shoulder, not specified as traumatic: Secondary | ICD-10-CM | POA: Diagnosis present

## 2019-01-10 DIAGNOSIS — M7581 Other shoulder lesions, right shoulder: Secondary | ICD-10-CM | POA: Insufficient documentation

## 2019-01-17 ENCOUNTER — Other Ambulatory Visit: Payer: Self-pay | Admitting: Surgery

## 2019-01-24 ENCOUNTER — Other Ambulatory Visit: Payer: Self-pay

## 2019-01-24 ENCOUNTER — Encounter
Admission: RE | Admit: 2019-01-24 | Discharge: 2019-01-24 | Disposition: A | Discharge status: Home / Self Care | Payer: Medicare Other | Source: Ambulatory Visit | Attending: Surgery | Admitting: Surgery

## 2019-01-24 DIAGNOSIS — Z01818 Encounter for other preprocedural examination: Secondary | ICD-10-CM | POA: Insufficient documentation

## 2019-01-24 DIAGNOSIS — R9431 Abnormal electrocardiogram [ECG] [EKG]: Secondary | ICD-10-CM | POA: Diagnosis not present

## 2019-01-24 DIAGNOSIS — I10 Essential (primary) hypertension: Secondary | ICD-10-CM | POA: Diagnosis not present

## 2019-01-24 DIAGNOSIS — I498 Other specified cardiac arrhythmias: Secondary | ICD-10-CM | POA: Diagnosis not present

## 2019-01-24 DIAGNOSIS — I4510 Unspecified right bundle-branch block: Secondary | ICD-10-CM | POA: Diagnosis not present

## 2019-01-24 HISTORY — DX: Malignant (primary) neoplasm, unspecified: C80.1

## 2019-01-24 HISTORY — DX: Headache, unspecified: R51.9

## 2019-01-24 LAB — BASIC METABOLIC PANEL
Anion gap: 11 (ref 5–15)
BUN: 29 mg/dL — ABNORMAL HIGH (ref 8–23)
CO2: 26 mmol/L (ref 22–32)
Calcium: 9.2 mg/dL (ref 8.9–10.3)
Chloride: 103 mmol/L (ref 98–111)
Creatinine, Ser: 1.3 mg/dL — ABNORMAL HIGH (ref 0.44–1.00)
GFR calc Af Amer: 44 mL/min — ABNORMAL LOW (ref >60)
GFR calc non Af Amer: 38 mL/min — ABNORMAL LOW (ref >60)
Glucose, Bld: 105 mg/dL — ABNORMAL HIGH (ref 70–99)
Potassium: 4 mmol/L (ref 3.5–5.1)
Sodium: 140 mmol/L (ref 135–145)

## 2019-01-24 NOTE — Patient Instructions (Signed)
Your procedure is scheduled on: 01-31-19 THURSDAY Report to Same Day Surgery 2nd floor medical mall [facility] Entrance-take elevator on left to 2nd floor.  Check in with surgery information desk.) To find out your arrival time please call [phone] between 1PM - 3PM on 01-30-19 [facility]  Remember: Instructions that are not followed completely may result in serious medical risk, up to and including death, or upon the discretion of your surgeon and anesthesiologist your surgery may need to be rescheduled.    _x___ 1. Do not eat food after midnight the night before your procedure. NO GUM OR CANDY AFTER MIDNIGHT. You may drink clear liquids up to 2 hours before you are scheduled to arrive at the hospital for your procedure.  Do not drink clear liquids within 2 hours of your scheduled arrival to the hospital.  Clear liquids include  --Water or Apple juice without pulp  --Clear carbohydrate beverage such as ClearFast or Gatorade  --Black Coffee or Clear Tea (No milk, no creamers, do not add anything to the coffee or Tea   ____Ensure clear carbohydrate drink on the way to the hospital for bariatric patients  ____Ensure clear carbohydrate drink 3 hours before surgery.    __x__ 2. No Alcohol for 24 hours before or after surgery.   __x__3. No Smoking or e-cigarettes for 24 prior to surgery.  Do not use any chewable tobacco products for at least 6 hour prior to surgery   ____  4. Bring all medications with you on the day of surgery if instructed.    __x__ 5. Notify your doctor if there is any change in your medical condition     (cold, fever, infections).    x___6. On the morning of surgery brush your teeth with toothpaste and water.  You may rinse your mouth with mouth wash if you wish.  Do not swallow any toothpaste or mouthwash.   Do not wear jewelry, make-up, hairpins, clips or nail polish.  Do not wear lotions, powders, or perfumes. You may wear deodorant.  Do not shave 48 hours  prior to surgery. Men may shave face and neck.  Do not bring valuables to the hospital.    [facility] is not responsible for any belongings or valuables.               Contacts, dentures or bridgework may not be worn into surgery.  Leave your suitcase in the car. After surgery it may be brought to your room.  For patients admitted to the hospital, discharge time is determined by your treatment team.  _  Patients discharged the day of surgery will not be allowed to drive home.  You will need someone to drive you home and stay with you the night of your procedure.    Please read over the following fact sheets that you were given:   [facility] Preparing for Surgery and or MRSA Information   _x___ TAKE THE FOLLOWING MEDICATION THE MORNING OF SURGERY WITH A SMALL SIP OF WATER. These include:  1. NORVASC (AMLODIPINE)  2. LIPITOR (ATORVASTATIN)  3.  4.  5.  6.  ____Fleets enema or Magnesium Citrate as directed.   _x___ Use CHG Soap or sage wipes as directed on instruction sheet   ____ Use inhalers on the day of surgery and bring to hospital day of surgery  ____ Stop Metformin and Janumet 2 days prior to surgery.    ____ Take 1/2 of usual insulin dose the night before surgery and  none on the morning surgery.   ____ Follow recommendations from Cardiologist, Pulmonologist or PCP regarding stopping Aspirin, Coumadin, Plavix ,Eliquis, Effient, or Pradaxa, and Pletal.  _X___Stop Anti-inflammatories such as Advil, Aleve, Ibuprofen, Motrin, Naproxen, Naprosyn, Goodies powders or aspirin products NOW-OK to take Tylenol    ____ Stop supplements until after surgery.     ____ Bring C-Pap to the hospital.

## 2019-01-28 ENCOUNTER — Other Ambulatory Visit
Admission: RE | Admit: 2019-01-28 | Discharge: 2019-01-28 | Disposition: A | Discharge status: Home / Self Care | Payer: Medicare Other | Source: Ambulatory Visit | Attending: Surgery | Admitting: Surgery

## 2019-01-28 ENCOUNTER — Other Ambulatory Visit: Payer: Self-pay

## 2019-01-28 DIAGNOSIS — Z01812 Encounter for preprocedural laboratory examination: Secondary | ICD-10-CM | POA: Insufficient documentation

## 2019-01-28 DIAGNOSIS — Z20828 Contact with and (suspected) exposure to other viral communicable diseases: Secondary | ICD-10-CM | POA: Insufficient documentation

## 2019-01-28 LAB — SARS CORONAVIRUS 2 (TAT 6-24 HRS): SARS Coronavirus 2: NEGATIVE

## 2019-01-30 MED ORDER — CEFAZOLIN SODIUM-DEXTROSE 2-4 GM/100ML-% IV SOLN
2.0000 g | INTRAVENOUS | Status: AC
  Administered 2019-01-31: 2 g via INTRAVENOUS

## 2019-01-31 ENCOUNTER — Encounter
Admission: RE | Disposition: A | Discharge status: Home / Self Care | Payer: Self-pay | Source: Home / Self Care | Attending: Surgery

## 2019-01-31 ENCOUNTER — Ambulatory Visit: Payer: Medicare Other | Admitting: Anesthesiology

## 2019-01-31 ENCOUNTER — Other Ambulatory Visit: Payer: Self-pay

## 2019-01-31 ENCOUNTER — Ambulatory Visit: Payer: Self-pay

## 2019-01-31 ENCOUNTER — Ambulatory Visit
Admission: RE | Admit: 2019-01-31 | Discharge: 2019-01-31 | Disposition: A | Discharge status: Home / Self Care | Payer: Medicare Other | Attending: Surgery | Admitting: Surgery

## 2019-01-31 ENCOUNTER — Encounter: Payer: Self-pay | Admitting: *Deleted

## 2019-01-31 DIAGNOSIS — I10 Essential (primary) hypertension: Secondary | ICD-10-CM | POA: Diagnosis not present

## 2019-01-31 DIAGNOSIS — M7581 Other shoulder lesions, right shoulder: Secondary | ICD-10-CM | POA: Insufficient documentation

## 2019-01-31 DIAGNOSIS — Z86010 Personal history of colonic polyps: Secondary | ICD-10-CM | POA: Insufficient documentation

## 2019-01-31 DIAGNOSIS — Z85828 Personal history of other malignant neoplasm of skin: Secondary | ICD-10-CM | POA: Diagnosis not present

## 2019-01-31 DIAGNOSIS — M25811 Other specified joint disorders, right shoulder: Secondary | ICD-10-CM | POA: Insufficient documentation

## 2019-01-31 DIAGNOSIS — Z885 Allergy status to narcotic agent status: Secondary | ICD-10-CM | POA: Diagnosis not present

## 2019-01-31 DIAGNOSIS — Z803 Family history of malignant neoplasm of breast: Secondary | ICD-10-CM | POA: Diagnosis not present

## 2019-01-31 DIAGNOSIS — Z96641 Presence of right artificial hip joint: Secondary | ICD-10-CM | POA: Insufficient documentation

## 2019-01-31 DIAGNOSIS — Z79899 Other long term (current) drug therapy: Secondary | ICD-10-CM | POA: Insufficient documentation

## 2019-01-31 DIAGNOSIS — M19011 Primary osteoarthritis, right shoulder: Secondary | ICD-10-CM | POA: Diagnosis not present

## 2019-01-31 DIAGNOSIS — Z90710 Acquired absence of both cervix and uterus: Secondary | ICD-10-CM | POA: Insufficient documentation

## 2019-01-31 DIAGNOSIS — E7800 Pure hypercholesterolemia, unspecified: Secondary | ICD-10-CM | POA: Diagnosis not present

## 2019-01-31 DIAGNOSIS — M7521 Bicipital tendinitis, right shoulder: Secondary | ICD-10-CM | POA: Insufficient documentation

## 2019-01-31 DIAGNOSIS — M75111 Incomplete rotator cuff tear or rupture of right shoulder, not specified as traumatic: Secondary | ICD-10-CM | POA: Insufficient documentation

## 2019-01-31 DIAGNOSIS — G43909 Migraine, unspecified, not intractable, without status migrainosus: Secondary | ICD-10-CM | POA: Diagnosis not present

## 2019-01-31 HISTORY — PX: SHOULDER ARTHROSCOPY WITH ROTATOR CUFF REPAIR AND SUBACROMIAL DECOMPRESSION: SHX5686

## 2019-01-31 SURGERY — SHOULDER ARTHROSCOPY WITH ROTATOR CUFF REPAIR AND SUBACROMIAL DECOMPRESSION
Anesthesia: General | Site: Shoulder | Laterality: Right

## 2019-01-31 MED ORDER — FAMOTIDINE 20 MG PO TABS
ORAL_TABLET | ORAL | Status: AC
  Filled 2019-01-31: qty 1

## 2019-01-31 MED ORDER — FENTANYL CITRATE (PF) 100 MCG/2ML IJ SOLN
50.0000 ug | Freq: Once | INTRAMUSCULAR | Status: AC
  Administered 2019-01-31: 10:00:00 50 ug via INTRAVENOUS

## 2019-01-31 MED ORDER — TRAMADOL HCL 50 MG PO TABS: 50.0000 mg | ORAL_TABLET | Freq: Four times a day (QID) | ORAL | 0 refills | Status: AC | PRN

## 2019-01-31 MED ORDER — ONDANSETRON HCL 4 MG/2ML IJ SOLN
INTRAMUSCULAR | Status: AC
  Administered 2019-01-31: 4 mg via INTRAVENOUS
  Filled 2019-01-31: qty 2

## 2019-01-31 MED ORDER — MIDAZOLAM HCL 2 MG/2ML IJ SOLN
INTRAMUSCULAR | Status: AC
  Administered 2019-01-31: 1 mg via INTRAVENOUS
  Filled 2019-01-31: qty 2

## 2019-01-31 MED ORDER — EPINEPHRINE PF 1 MG/ML IJ SOLN
INTRAMUSCULAR | Status: AC
  Filled 2019-01-31: qty 1

## 2019-01-31 MED ORDER — BUPIVACAINE-EPINEPHRINE 0.5% -1:200000 IJ SOLN
INTRAMUSCULAR | Status: DC | PRN
  Administered 2019-01-31: 30 mL

## 2019-01-31 MED ORDER — FENTANYL CITRATE (PF) 100 MCG/2ML IJ SOLN
INTRAMUSCULAR | Status: DC | PRN
  Administered 2019-01-31: 50 ug via INTRAVENOUS
  Administered 2019-01-31: 100 ug via INTRAVENOUS

## 2019-01-31 MED ORDER — ONDANSETRON HCL 4 MG/2ML IJ SOLN
4.0000 mg | Freq: Once | INTRAMUSCULAR | Status: AC | PRN
  Administered 2019-01-31: 13:00:00 4 mg via INTRAVENOUS

## 2019-01-31 MED ORDER — PHENYLEPHRINE HCL-NACL 10-0.9 MG/250ML-% IV SOLN
INTRAVENOUS | Status: DC | PRN
  Administered 2019-01-31: 30 ug/min via INTRAVENOUS

## 2019-01-31 MED ORDER — METOCLOPRAMIDE HCL 5 MG/ML IJ SOLN
INTRAMUSCULAR | Status: AC
  Filled 2019-01-31: qty 2

## 2019-01-31 MED ORDER — LIDOCAINE HCL (PF) 1 % IJ SOLN
INTRAMUSCULAR | Status: DC | PRN
  Administered 2019-01-31: .6 mL via SUBCUTANEOUS

## 2019-01-31 MED ORDER — DEXAMETHASONE SODIUM PHOSPHATE 10 MG/ML IJ SOLN
10.0000 mg | Freq: Once | INTRAMUSCULAR | Status: AC
  Administered 2019-01-31: 14:00:00 10 mg via INTRAVENOUS
  Filled 2019-01-31: qty 1

## 2019-01-31 MED ORDER — LIDOCAINE HCL (PF) 2 % IJ SOLN
INTRAMUSCULAR | Status: AC
  Filled 2019-01-31: qty 10

## 2019-01-31 MED ORDER — FENTANYL CITRATE (PF) 100 MCG/2ML IJ SOLN
INTRAMUSCULAR | Status: AC
  Filled 2019-01-31: qty 2

## 2019-01-31 MED ORDER — DEXAMETHASONE SODIUM PHOSPHATE 10 MG/ML IJ SOLN
INTRAMUSCULAR | Status: AC
  Filled 2019-01-31: qty 1

## 2019-01-31 MED ORDER — POTASSIUM CHLORIDE IN NACL 20-0.9 MEQ/L-% IV SOLN
INTRAVENOUS | Status: DC
  Filled 2019-01-31: qty 1000

## 2019-01-31 MED ORDER — ONDANSETRON HCL 4 MG PO TABS: 4.0000 mg | ORAL_TABLET | Freq: Four times a day (QID) | ORAL | Status: DC | PRN

## 2019-01-31 MED ORDER — FENTANYL CITRATE (PF) 100 MCG/2ML IJ SOLN
INTRAMUSCULAR | Status: AC
  Administered 2019-01-31: 50 ug via INTRAVENOUS
  Filled 2019-01-31: qty 2

## 2019-01-31 MED ORDER — BUPIVACAINE HCL (PF) 0.5 % IJ SOLN
INTRAMUSCULAR | Status: DC | PRN
  Administered 2019-01-31: 10 mL via PERINEURAL

## 2019-01-31 MED ORDER — LACTATED RINGERS IV SOLN
INTRAVENOUS | Status: DC
  Administered 2019-01-31: 09:00:00 via INTRAVENOUS

## 2019-01-31 MED ORDER — LIDOCAINE HCL (PF) 1 % IJ SOLN
INTRAMUSCULAR | Status: AC
  Filled 2019-01-31: qty 5

## 2019-01-31 MED ORDER — ONDANSETRON HCL 4 MG/2ML IJ SOLN
INTRAMUSCULAR | Status: DC | PRN
  Administered 2019-01-31: 4 mg via INTRAVENOUS

## 2019-01-31 MED ORDER — CHLORHEXIDINE GLUCONATE 4 % EX LIQD: 60.0000 mL | Freq: Once | CUTANEOUS | Status: DC

## 2019-01-31 MED ORDER — CEFAZOLIN SODIUM-DEXTROSE 2-4 GM/100ML-% IV SOLN
INTRAVENOUS | Status: AC
  Filled 2019-01-31: qty 100

## 2019-01-31 MED ORDER — LIDOCAINE HCL (CARDIAC) PF 100 MG/5ML IV SOSY
PREFILLED_SYRINGE | INTRAVENOUS | Status: DC | PRN
  Administered 2019-01-31: 100 mg via INTRAVENOUS

## 2019-01-31 MED ORDER — BUPIVACAINE LIPOSOME 1.3 % IJ SUSP
INTRAMUSCULAR | Status: DC | PRN
  Administered 2019-01-31: 20 mL via PERINEURAL

## 2019-01-31 MED ORDER — ONDANSETRON 4 MG PO TBDP: 4.0000 mg | ORAL_TABLET | Freq: Three times a day (TID) | ORAL | 1 refills | Status: AC | PRN

## 2019-01-31 MED ORDER — METOCLOPRAMIDE HCL 10 MG PO TABS: 5.0000 mg | ORAL_TABLET | Freq: Three times a day (TID) | ORAL | Status: DC | PRN

## 2019-01-31 MED ORDER — BUPIVACAINE LIPOSOME 1.3 % IJ SUSP
INTRAMUSCULAR | Status: AC
  Filled 2019-01-31: qty 20

## 2019-01-31 MED ORDER — PROPOFOL 10 MG/ML IV BOLUS
INTRAVENOUS | Status: DC | PRN
  Administered 2019-01-31: 120 mg via INTRAVENOUS

## 2019-01-31 MED ORDER — MIDAZOLAM HCL 2 MG/2ML IJ SOLN
1.0000 mg | Freq: Once | INTRAMUSCULAR | Status: AC
  Administered 2019-01-31: 10:00:00 1 mg via INTRAVENOUS

## 2019-01-31 MED ORDER — ROCURONIUM BROMIDE 50 MG/5ML IV SOLN
INTRAVENOUS | Status: AC
  Filled 2019-01-31: qty 2

## 2019-01-31 MED ORDER — PROPOFOL 10 MG/ML IV BOLUS
INTRAVENOUS | Status: AC
  Filled 2019-01-31: qty 20

## 2019-01-31 MED ORDER — METOCLOPRAMIDE HCL 5 MG/ML IJ SOLN
5.0000 mg | Freq: Three times a day (TID) | INTRAMUSCULAR | Status: DC | PRN
  Administered 2019-01-31: 10 mg via INTRAVENOUS

## 2019-01-31 MED ORDER — BUPIVACAINE HCL (PF) 0.5 % IJ SOLN
INTRAMUSCULAR | Status: AC
  Filled 2019-01-31: qty 10

## 2019-01-31 MED ORDER — FENTANYL CITRATE (PF) 100 MCG/2ML IJ SOLN: 25.0000 ug | INTRAMUSCULAR | Status: DC | PRN

## 2019-01-31 MED ORDER — SUGAMMADEX SODIUM 200 MG/2ML IV SOLN
INTRAVENOUS | Status: DC | PRN
  Administered 2019-01-31: 200 mg via INTRAVENOUS

## 2019-01-31 MED ORDER — ONDANSETRON HCL 4 MG/2ML IJ SOLN: 4.0000 mg | Freq: Four times a day (QID) | INTRAMUSCULAR | Status: DC | PRN

## 2019-01-31 MED ORDER — ROCURONIUM BROMIDE 100 MG/10ML IV SOLN
INTRAVENOUS | Status: DC | PRN
  Administered 2019-01-31: 50 mg via INTRAVENOUS

## 2019-01-31 MED ORDER — FAMOTIDINE 20 MG PO TABS
20.0000 mg | ORAL_TABLET | Freq: Once | ORAL | Status: AC
  Administered 2019-01-31: 09:00:00 20 mg via ORAL

## 2019-01-31 SURGICAL SUPPLY — 51 items
ANCHOR BONE REGENETEN (Anchor) ×1 IMPLANT
ANCHOR JUGGERKNOT WTAP NDL 2.9 (Anchor) ×2 IMPLANT
ANCHOR TENDON REGENETEN (Staple) ×2 IMPLANT
BIT DRILL JUGRKNT W/NDL BIT2.9 (DRILL) IMPLANT
BLADE FULL RADIUS 3.5 (BLADE) ×2 IMPLANT
BUR ACROMIONIZER 4.0 (BURR) ×2 IMPLANT
CANNULA SHAVER 8MMX76MM (CANNULA) ×2 IMPLANT
CHLORAPREP W/TINT 26 (MISCELLANEOUS) ×2 IMPLANT
COVER MAYO STAND REUSABLE (DRAPES) ×2 IMPLANT
COVER WAND RF STERILE (DRAPES) ×2 IMPLANT
DILATOR 5.5 THREADED HEALICOIL (MISCELLANEOUS) ×1 IMPLANT
DRAPE IMP U-DRAPE 54X76 (DRAPES) ×4 IMPLANT
DRAPE SPLIT 6X30 W/TAPE (DRAPES) ×4 IMPLANT
DRILL JUGGERKNOT W/NDL BIT 2.9 (DRILL) ×2
ELECT CAUTERY BLADE TIP 2.5 (TIP) ×2
ELECT REM PT RETURN 9FT ADLT (ELECTROSURGICAL) ×2
ELECTRODE CAUTERY BLDE TIP 2.5 (TIP) ×1 IMPLANT
ELECTRODE REM PT RTRN 9FT ADLT (ELECTROSURGICAL) ×1 IMPLANT
GAUZE SPONGE 4X4 12PLY STRL (GAUZE/BANDAGES/DRESSINGS) ×2 IMPLANT
GAUZE XEROFORM 1X8 LF (GAUZE/BANDAGES/DRESSINGS) ×2 IMPLANT
GLOVE BIO SURGEON STRL SZ7.5 (GLOVE) ×4 IMPLANT
GLOVE BIO SURGEON STRL SZ8 (GLOVE) ×4 IMPLANT
GLOVE BIOGEL PI IND STRL 8 (GLOVE) ×1 IMPLANT
GLOVE BIOGEL PI INDICATOR 8 (GLOVE) ×1
GLOVE INDICATOR 8.0 STRL GRN (GLOVE) ×2 IMPLANT
GOWN STRL REUS W/ TWL LRG LVL3 (GOWN DISPOSABLE) ×1 IMPLANT
GOWN STRL REUS W/ TWL XL LVL3 (GOWN DISPOSABLE) ×1 IMPLANT
GOWN STRL REUS W/TWL LRG LVL3 (GOWN DISPOSABLE) ×3
GOWN STRL REUS W/TWL XL LVL3 (GOWN DISPOSABLE) ×1
GRASPER SUT 15 45D LOW PRO (SUTURE) IMPLANT
IMPL REGENETEN MEDIUM (Shoulder) IMPLANT
IMPLANT REGENETEN MEDIUM (Shoulder) ×2 IMPLANT
IV LACTATED RINGER IRRG 3000ML (IV SOLUTION) ×2
IV LR IRRIG 3000ML ARTHROMATIC (IV SOLUTION) ×2 IMPLANT
MANIFOLD NEPTUNE II (INSTRUMENTS) ×2 IMPLANT
MASK FACE SPIDER DISP (MASK) ×2 IMPLANT
MAT ABSORB  FLUID 56X50 GRAY (MISCELLANEOUS) ×1
MAT ABSORB FLUID 56X50 GRAY (MISCELLANEOUS) ×1 IMPLANT
PACK ARTHROSCOPY SHOULDER (MISCELLANEOUS) ×2 IMPLANT
PASSER SUT FIRSTPASS SELF (INSTRUMENTS) IMPLANT
SLING ARM LRG DEEP (SOFTGOODS) ×1 IMPLANT
SLING ULTRA II LG (MISCELLANEOUS) ×1 IMPLANT
STAPLER SKIN PROX 35W (STAPLE) ×2 IMPLANT
STRAP SAFETY 5IN WIDE (MISCELLANEOUS) ×2 IMPLANT
SUT ETHIBOND 0 MO6 C/R (SUTURE) ×2 IMPLANT
SUT VIC AB 2-0 CT1 27 (SUTURE) ×2
SUT VIC AB 2-0 CT1 TAPERPNT 27 (SUTURE) ×2 IMPLANT
TAPE MICROFOAM 4IN (TAPE) ×2 IMPLANT
TUBING ARTHRO INFLOW-ONLY STRL (TUBING) ×2 IMPLANT
TUBING CONNECTING 10 (TUBING) ×2 IMPLANT
WAND WEREWOLF FLOW 90D (MISCELLANEOUS) ×2 IMPLANT

## 2019-01-31 NOTE — Transfer of Care (Signed)
Immediate Anesthesia Transfer of Care Note  Patient: Shylynn Storie Chawla  Procedure(s) Performed: SHOULDER ARTHROSCOPY WITH DEBRIDEMENT, DECOMPRESSION, AND POSSIBLE ROTATOR CUFF. (Right Shoulder)  Patient Location: PACU  Anesthesia Type:General  Level of Consciousness: awake, alert  and oriented  Airway & Oxygen Therapy: Patient Spontanous Breathing and Patient connected to nasal cannula oxygen  Post-op Assessment: Report given to RN and Post -op Vital signs reviewed and stable  Post vital signs: Reviewed and stable  Last Vitals:  Vitals Value Taken Time  BP 130/53 01/31/19 1311  Temp    Pulse 73 01/31/19 1313  Resp 14 01/31/19 1313  SpO2 100 % 01/31/19 1313  Vitals shown include unvalidated device data.  Last Pain:  Vitals:   01/31/19 1019  TempSrc:   PainSc: 0-No pain      Patients Stated Pain Goal: 0 ([phone] [id number])  Complications: No apparent anesthesia complications

## 2019-01-31 NOTE — Anesthesia Preprocedure Evaluation (Addendum)
Anesthesia Evaluation  Patient identified by MRN, date of birth, ID band Patient awake    Reviewed: Allergy & Precautions, NPO status , Patient's Chart, lab work & pertinent test results  History of Anesthesia Complications (+) PONV and history of anesthetic complications  Airway Mallampati: II       Dental  (+) Edentulous Upper, Edentulous Lower   Pulmonary neg sleep apnea, neg COPD, Not current smoker, former smoker,           Cardiovascular hypertension, Pt. on medications (-) Past MI and (-) CHF (-) dysrhythmias (-) Valvular Problems/Murmurs     Neuro/Psych neg Seizures    GI/Hepatic Neg liver ROS, neg GERD  ,  Endo/Other  neg diabetes  Renal/GU negative Renal ROS     Musculoskeletal   Abdominal   Peds  Hematology   Anesthesia Other Findings   Reproductive/Obstetrics                            Anesthesia Physical Anesthesia Plan  ASA: II  Anesthesia Plan: General   Post-op Pain Management: GA combined w/ Regional for post-op pain   Induction:   PONV Risk Score and Plan: 4 or greater and Ondansetron, Midazolam, Dexamethasone and Treatment may vary due to age or medical condition  Airway Management Planned: Oral ETT  Additional Equipment:   Intra-op Plan:   Post-operative Plan:   Informed Consent: I have reviewed the patients History and Physical, chart, labs and discussed the procedure including the risks, benefits and alternatives for the proposed anesthesia with the patient or authorized representative who has indicated his/her understanding and acceptance.       Plan Discussed with:   Anesthesia Plan Comments:         Anesthesia Quick Evaluation

## 2019-01-31 NOTE — Discharge Instructions (Addendum)
AMBULATORY SURGERY  DISCHARGE INSTRUCTIONS   1) The drugs that you were given will stay in your system until tomorrow so for the next 24 hours you should not:  A) Drive an automobile B) Make any legal decisions C) Drink any alcoholic beverage   2) You may resume regular meals tomorrow.  Today it is better to start with liquids and gradually work up to solid foods.  You may eat anything you prefer, but it is better to start with liquids, then soup and crackers, and gradually work up to solid foods.   3) Please notify your doctor immediately if you have any unusual bleeding, trouble breathing, redness and pain at the surgery site, drainage, fever, or pain not relieved by medication.    4) Additional Instructions:        Please contact your physician with any problems or Same Day Surgery at [phone], Monday through Friday 6 am to 4 pm, or [facility] number at [phone].Orthopedic discharge instructions: Keep dressing dry and intact.  May shower after dressing changed on post-op day #4 (Monday).  Cover staples with Band-Aids after drying off. Apply ice frequently to shoulder. Take ibuprofen 600 mg TID with meals for 7-10 days, then as necessary. Take Tramadol as prescribed when needed.  May supplement with ES Tylenol if necessary. Keep shoulder immobilizer on at all times except may remove for bathing purposes. Follow-up in 10-14 days or as scheduled.

## 2019-01-31 NOTE — H&P (Signed)
Paper H&P to be scanned into permanent record. H&P reviewed and patient re-examined. No changes. 

## 2019-01-31 NOTE — Anesthesia Post-op Follow-up Note (Signed)
Anesthesia QCDR form completed.        

## 2019-01-31 NOTE — Anesthesia Procedure Notes (Addendum)
Procedure Name: Intubation Date/Time: 01/31/2019 11:25 AM Performed by: Bernardo Heater, CRNA Pre-anesthesia Checklist: Patient identified, Patient being monitored, Timeout performed, Emergency Drugs available and Suction available Patient Re-evaluated:Patient Re-evaluated prior to induction Oxygen Delivery Method: Circle system utilized Preoxygenation: Pre-oxygenation with 100% oxygen Induction Type: IV induction Laryngoscope Size: Mac and 3 Grade View: Grade I Tube type: Oral Tube size: 6.5 mm Number of attempts: 1 Placement Confirmation: ETT inserted through vocal cords under direct vision,  positive ETCO2 and breath sounds checked- equal and bilateral Secured at: 21 cm Tube secured with: Tape Dental Injury: Teeth and Oropharynx as per pre-operative assessment

## 2019-01-31 NOTE — Anesthesia Procedure Notes (Signed)
Anesthesia Regional Block: Interscalene brachial plexus block   Pre-Anesthetic Checklist: ,, timeout performed, Correct Patient, Correct Site, Correct Laterality, Correct Procedure, Correct Position, site marked, Risks and benefits discussed,  Surgical consent,  Pre-op evaluation,  At surgeon's request and post-op pain management  Laterality: Right  Prep: chloraprep       Needles:  Injection technique: Single-shot  Needle Type: Echogenic Needle     Needle Length: 9cm  Needle Gauge: 21     Additional Needles:   Procedures:, nerve stimulator,,, ultrasound used (permanent image in chart),,,,   Nerve Stimulator or Paresthesia:  Response: biceps flexion, 0.8 mA,   Additional Responses:   Narrative:  Start time: 01/31/2019 10:13 AM End time: 01/31/2019 10:18 AM Injection made incrementally with aspirations every 5 mL.  Performed by: Personally   Additional Notes: Functioning IV was confirmed and monitors were applied.  A 31mm 22ga Stimuplex needle was used. Sterile prep and drape,hand hygiene and sterile gloves were used.  Negative aspiration and negative test dose prior to incremental administration of local anesthetic. The patient tolerated the procedure well.

## 2019-01-31 NOTE — Anesthesia Postprocedure Evaluation (Signed)
Anesthesia Post Note  Patient: Chloe Rhodes  Procedure(s) Performed: SHOULDER ARTHROSCOPY WITH DEBRIDEMENT, DECOMPRESSION, AND POSSIBLE ROTATOR CUFF. (Right Shoulder)  Patient location during evaluation: PACU Anesthesia Type: General Level of consciousness: awake and alert Pain management: pain level controlled Vital Signs Assessment: post-procedure vital signs reviewed and stable Respiratory status: spontaneous breathing and respiratory function stable Cardiovascular status: stable Anesthetic complications: no     Last Vitals:  Vitals:   01/31/19 1311 01/31/19 1326  BP: (!) 130/53 (!) 136/55  Pulse: 79 71  Resp: 16 (!) 23  Temp: (!) 35.8 C   SpO2: 100% 100%    Last Pain:  Vitals:   01/31/19 1326  TempSrc:   PainSc: 0-No pain                 Timberly Yott K

## 2019-01-31 NOTE — Op Note (Signed)
01/31/2019  1:20 PM  Patient:   Chloe Rhodes  Pre-Op Diagnosis:   Impingement/tendinopathy with at least partial thickness rotator cuff tear, right shoulder.  Post-Op Diagnosis:   Impingement/tendinopathy with partial-thickness rotator cuff tear early degenerative joint disease, and biceps tendinopathy, right shoulder.  Procedure:   Extensive arthroscopic debridement, arthroscopic subacromial decompression, mini-open rotator cuff repair, and mini-open biceps tenodesis, right shoulder.  Anesthesia:   General endotracheal with interscalene block using Exparel placed preoperatively by the anesthesiologist.  Surgeon:   Pascal Lux, MD  Assistant:   Cameron Proud; Erin Mecum, PA-S  Findings:   As above. There was a small articular surface partial-thickness tear involving the entire insertional fibers of the supraspinatus tendon which measured approximately 8 x 10 mm and appeared to involve at least 50% of the thickness of the tendon. There was an area of longitudinal partial-thickness tearing of the superior insertional fibers of the supraspinatus tendon without loss of the subscapularis footprint on the lesser tuberosity. The remainder of the rotator cuff was in satisfactory condition. The biceps tendon demonstrated evidence of striations of the fibers, consistent with chronic friction through the bicipital groove.  There were extensive grade 3 chondromalacial changes involving the anterior and mid superior portions of the humeral head, as well as small areas of focal grade I-II chondromalacia involving the anterior portion of the glenoid articular surface.  Complications:   None  Fluids:   650 cc  Estimated blood loss:   5 cc  Tourniquet time:   None  Drains:   None  Closure:   Staples      Brief clinical note:   The patient is AN 84 year old female with a history of gradually worsening right shoulder pain. The patient's symptoms have progressed despite medications, activity  modification, etc. The patient's history and examination are consistent with impingement/tendinopathy with a rotator cuff tear. These findings were confirmed by MRI scan. The patient presents at this time for definitive management of these shoulder symptoms.  Procedure:   The patient underwent placement of an interscalene block using Exparel by the anesthesiologist in the preoperative holding area before being brought into the operating room and lain in the supine position. The patient then underwent general endotracheal intubation and anesthesia before being repositioned in the beach chair position using the beach chair positioner. The right shoulder and upper extremity were prepped with ChloraPrep solution before being draped sterilely. Preoperative antibiotics were administered. A timeout was performed to confirm the proper surgical site before the expected portal sites and incision site were injected with 0.5% Sensorcaine with epinephrine. A posterior portal was created and the glenohumeral joint thoroughly inspected with the findings as described above. An anterior portal was created using an outside-in technique. The labrum and rotator cuff were further probed, again confirming the above-noted findings. Areas of synovitis, labral fraying, and the torn portions of the rotator cuff were debrided back to stable margins using the full-radius resector.  In addition, areas of grade 3 chondromalacial changes were debrided back to stable margins using the full-radius resector. The ArthroCare wand was inserted and used to release the biceps tendon from its labral anchor. It also was used to obtain hemostasis as well as to "anneal" the labrum superiorly and anteriorly. The instruments were removed from the joint after suctioning the excess fluid.  The camera was repositioned through the posterior portal into the subacromial space. A separate lateral portal was created using an outside-in technique. The 3.5 mm  full-radius resector was introduced and  used to perform a subtotal bursectomy. The ArthroCare wand was then inserted and used to remove the periosteal tissue off the undersurface of the anterior third of the acromion as well as to recess the coracoacromial ligament from its attachment along the anterior and lateral margins of the acromion. The 4.0 mm acromionizing bur was introduced and used to complete the decompression by removing the undersurface of the anterior third of the acromion. The full radius resector was reintroduced to remove any residual bony debris before the ArthroCare wand was reintroduced to obtain hemostasis. The instruments were then removed from the subacromial space after suctioning the excess fluid.  An approximately 4-5 cm incision was made over the anterolateral aspect of the shoulder beginning at the anterolateral corner of the acromion and extending distally in line with the bicipital groove. This incision was carried down through the subcutaneous tissues to expose the deltoid fascia. The raphae between the anterior and middle thirds was identified and this plane developed to provide access into the subacromial space. Additional bursal tissues were debrided sharply using Metzenbaum scissors. The rotator cuff tear was readily identified. The margins were debrided sharply with a #15 blade and the exposed greater tuberosity roughened with a rongeur. The tear was repaired using a single Biomet 2.9 mm JuggerKnot anchor. These sutures were then brought back laterally and secured using one [employer] 5.5 mm Healicoil knotless anchor to create a two-layer closure. An apparent watertight closure was obtained.  The bicipital groove was identified by palpation and opened for 1-1.5 cm. The biceps tendon stump was retrieved through this defect. The floor of the bicipital groove was roughened with a curet before another Biomet 2.9 mm JuggerKnot anchor was inserted. Both sets of sutures were  passed through the biceps tendon and tied securely to effect the tenodesis. The bicipital sheath was reapproximated using two #0 Ethibond interrupted sutures, incorporating the biceps tendon to further reinforce the tenodesis.  The wound was copiously irrigated with sterile saline solution before the deltoid raphae was reapproximated using 2-0 Vicryl interrupted sutures. The subcutaneous tissues were closed in two layers using 2-0 Vicryl interrupted sutures before the skin was closed using staples. The portal sites also were closed using staples. A sterile bulky dressing was applied to the shoulder before the arm was placed into a shoulder immobilizer. The patient was then awakened, extubated, and returned to the recovery room in satisfactory condition after tolerating the procedure well.

## 2019-02-01 ENCOUNTER — Encounter: Payer: Self-pay | Admitting: Surgery

## 2019-03-06 ENCOUNTER — Encounter: Payer: Self-pay | Admitting: Surgery

## 2019-11-11 ENCOUNTER — Other Ambulatory Visit: Payer: Self-pay | Admitting: Otolaryngology

## 2019-11-11 DIAGNOSIS — R430 Anosmia: Secondary | ICD-10-CM

## 2019-11-11 DIAGNOSIS — H93A2 Pulsatile tinnitus, left ear: Secondary | ICD-10-CM

## 2019-11-29 ENCOUNTER — Ambulatory Visit
Admission: RE | Admit: 2019-11-29 | Discharge: 2019-11-29 | Disposition: A | Discharge status: Home / Self Care | Payer: Medicare Other | Source: Ambulatory Visit | Attending: Otolaryngology | Admitting: Otolaryngology

## 2019-11-29 ENCOUNTER — Other Ambulatory Visit: Payer: Self-pay

## 2019-11-29 DIAGNOSIS — R430 Anosmia: Secondary | ICD-10-CM

## 2019-11-29 DIAGNOSIS — H93A2 Pulsatile tinnitus, left ear: Secondary | ICD-10-CM | POA: Diagnosis present

## 2019-11-29 MED ORDER — GADOBUTROL 1 MMOL/ML IV SOLN
6.0000 mL | Freq: Once | INTRAVENOUS | Status: AC | PRN
  Administered 2019-11-29: 6 mL via INTRAVENOUS

## 2020-01-13 ENCOUNTER — Other Ambulatory Visit: Payer: Self-pay

## 2020-01-13 ENCOUNTER — Ambulatory Visit: Payer: Medicare Other | Admitting: Gastroenterology

## 2020-01-13 ENCOUNTER — Encounter: Payer: Self-pay | Admitting: Gastroenterology

## 2020-01-13 VITALS — BP 128/64 | HR 91 | Ht 68.0 in | Wt 140.8 lb

## 2020-01-13 DIAGNOSIS — K591 Functional diarrhea: Secondary | ICD-10-CM | POA: Diagnosis not present

## 2020-01-13 NOTE — Progress Notes (Signed)
Gastroenterology Consultation  Referring Provider:     Albina Billet, MD Primary Care Physician:  Albina Billet, MD Primary Gastroenterologist:  Dr. Allen Norris     Reason for Consultation:     Diarrhea        HPI:   Chloe Rhodes is a 85 y.o. y/o female referred for consultation & management of diarrhea by Dr. Hall Busing, Leona Carry, MD.  This patient comes to me after being seen in the past by Dr. Jamal Collin for multiple colonoscopies with the last one being done when the patient was 85 years old.  The patient was reported to have a history of colon polyps. The patient reports that she does not want to undergo any further endoscopic procedures and would like some medication for her diarrhea.  The patient states that her diarrhea has been going on for at least 5 to 7 years.  She denies telling Dr. Jamal Collin that she was having diarrhea when she last saw him for colonoscopy a few years back.  She reports that when she has any meal she then usually has the diarrhea within 30 minutes of eating the food.  She does not have diarrhea that is not associated with eating and denies waking up in the middle the night due to diarrhea. The patient denies any black stools or bloody stools.  She also denies any nausea vomiting or abdominal pain associated with the diarrhea.  Past Medical History:  Diagnosis Date  . Allergy    codeine  . Arthritis   . Breast screening, unspecified   . Cancer ([place])    skin cancer to nose  . Complication of anesthesia   . H/O cystitis 2012  . Headache    migraines  . Hypertension   . Personal history of tobacco use, presenting hazards to health   . PONV (postoperative nausea and vomiting)   . Special screening for malignant neoplasms, colon 2013    Past Surgical History:  Procedure Laterality Date  . ABDOMINAL HYSTERECTOMY    . BELPHAROPTOSIS REPAIR    . CATARACT EXTRACTION W/PHACO Left 09/01/2015   Procedure: CATARACT EXTRACTION PHACO AND INTRAOCULAR LENS PLACEMENT (IOC);  Surgeon:  Birder Robson, MD;  Location: [facility] OR[facility];  Service: Ophthalmology;  Laterality: Left;  [place] 00:44 AP% 19.5 CDE 8.69 Fluide pack lot # [id number]  . CATARACT EXTRACTION W/PHACO Right 09/22/2015   Procedure: CATARACT EXTRACTION PHACO AND INTRAOCULAR LENS PLACEMENT (IOC);  Surgeon: Birder Robson, MD;  Location: [facility] OR[facility];  Service: Ophthalmology;  Laterality: Right;  [place] 00:59 AP% 19.7 CDE 11.72 fluid pack lot # [id number]  . COLONOSCOPY W/ POLYPECTOMY  [id number]   Dr. Sankar-04/08/11-a few sessile polyps were found in the distal sigmoid colon,53mm in size; a benign appearing sessile polyp(40mm) was found in the rectum. Pathology-benign  . COLONOSCOPY WITH PROPOFOL N/A 06/01/2016   Procedure: COLONOSCOPY WITH PROPOFOL;  Surgeon: Christene Lye, MD;  Location: [facility];  Service: Endoscopy;  Laterality: N/A;  . HEMORRHOID SURGERY  2012   about 20 years ago  . JOINT REPLACEMENT Right [id number]   total hip/ partial knee both on right side  . SHOULDER ARTHROSCOPY WITH ROTATOR CUFF REPAIR AND SUBACROMIAL DECOMPRESSION Right 01/31/2019   Procedure: SHOULDER ARTHROSCOPY WITH DEBRIDEMENT, DECOMPRESSION, AND POSSIBLE ROTATOR CUFF.;  Surgeon: Corky Mull, MD;  Location: [facility] OR[facility];  Service: Orthopedics;  Laterality: Right;    Prior to Admission medications   Medication Sig Start Date End Date Taking? Authorizing Provider  amLODipine (NORVASC) 5 MG  tablet Take 5 mg by mouth every morning.     [provider]  atorvastatin (LIPITOR) 20 MG tablet Take 20 mg by mouth every morning.     [provider]  benazepril (LOTENSIN) 10 MG tablet Take 10 mg by mouth every morning.     [provider]  hydrochlorothiazide (HYDRODIURIL) 25 MG tablet Take 25 mg by mouth every morning.     [provider]  ondansetron (ZOFRAN ODT) 4 MG disintegrating tablet Take 1 tablet (4 mg total) by mouth every 8 (eight) hours as needed for nausea or vomiting. 01/31/19   Poggi, Marshall Cork, MD   traMADol (ULTRAM) 50 MG tablet Take 1 tablet (50 mg total) by mouth every 6 (six) hours as needed. 01/31/19 01/31/20  Poggi, Marshall Cork, MD    No family history on file.   Social History   Tobacco Use  . Smoking status: Former Smoker    Packs/day: 1.00    Years: 30.00    Pack years: 30.00    Types: Cigarettes    Quit date: 04/04/2014    Years since quitting: 5.7  . Smokeless tobacco: Never Used  Vaping Use  . Vaping Use: Never used  Substance Use Topics  . Alcohol use: No  . Drug use: No    Allergies as of 01/13/2020 - Review Complete 01/31/2019  Allergen Reaction Noted  . Codeine Nausea Only and Other (See Comments) 10/17/2012  . Morphine and related Nausea Only 08/26/2015    Review of Systems:    All systems reviewed and negative except where noted in HPI.   Physical Exam:  There were no vitals taken for this visit. No LMP recorded. Patient has had a hysterectomy. General:   Alert,  Well-developed, well-nourished, pleasant and cooperative in NAD Head:  Normocephalic and atraumatic. Eyes:  Sclera clear, no icterus.   Conjunctiva pink. Ears:  Normal auditory acuity. Neck:  Supple; no masses or thyromegaly. Lungs:  Respirations even and unlabored.  Clear throughout to auscultation.   No wheezes, crackles, or rhonchi. No acute distress. Heart:  Regular rate and rhythm; no murmurs, clicks, rubs, or gallops. Abdomen:  Normal bowel sounds.  No bruits.  Soft, non-tender and non-distended without masses, hepatosplenomegaly or hernias noted.  No guarding or rebound tenderness.  Negative Carnett sign.   Rectal:  Deferred.  Pulses:  Normal pulses noted. Extremities:  No clubbing or edema.  No cyanosis. Neurologic:  Alert and oriented x3;  grossly normal neurologically. Skin:  Intact without significant lesions or rashes.  No jaundice. Lymph Nodes:  No significant cervical adenopathy. Psych:  Alert and cooperative. Normal mood and affect.  Imaging Studies: No results  found.  Assessment and Plan:   [name] is a 85 y.o. y/o female who comes in today with chronic diarrhea that she states has been going on for between 5 and 7 years and is always after she eats.  There is no report of any worry symptoms such as unexplained weight loss fevers chills nausea or or vomiting.  The patient would like to avoid any endoscopic procedures at this time.  The patient has been told to start Imodium by trying to take it half hour before she eats a meal and see how long it controls the diarrhea and dose it appropriately based on her response.  If the medication does not work she can always be started on Viberzi since she has not had her gallbladder out.  The patient has been explained the plan  and agrees with it.  She will contact my office if the diarrhea does not improve.  The patient has also been told to watch out for constipation and to stop the Imodium if the constipation should occur.    Lucilla Lame, MD. Marval Regal    Note: This dictation was prepared with Dragon dictation along with smaller phrase technology. Any transcriptional errors that result from this process are unintentional.

## 2020-11-06 ENCOUNTER — Other Ambulatory Visit: Payer: Self-pay | Admitting: Neurosurgery

## 2020-11-06 ENCOUNTER — Other Ambulatory Visit (HOSPITAL_COMMUNITY): Payer: Self-pay | Admitting: Neurosurgery

## 2020-11-06 DIAGNOSIS — D329 Benign neoplasm of meninges, unspecified: Secondary | ICD-10-CM

## 2020-11-16 ENCOUNTER — Ambulatory Visit: Payer: Medicare Other

## 2020-12-03 ENCOUNTER — Ambulatory Visit
Admission: RE | Admit: 2020-12-03 | Discharge: 2020-12-03 | Disposition: A | Discharge status: Home / Self Care | Payer: Medicare Other | Source: Ambulatory Visit | Attending: Neurosurgery | Admitting: Neurosurgery

## 2020-12-03 DIAGNOSIS — D329 Benign neoplasm of meninges, unspecified: Secondary | ICD-10-CM | POA: Diagnosis not present

## 2020-12-03 MED ORDER — GADOBUTROL 1 MMOL/ML IV SOLN
6.0000 mL | Freq: Once | INTRAVENOUS | Status: AC | PRN
  Administered 2020-12-03: 6 mL via INTRAVENOUS

## 2021-11-22 IMAGING — MR MR HEAD WO/W CM
15 series · 48 of 48 positions shown · IV contrast (gadavist)
Comparison: MRI/MRA head 11/29/2019.

CLINICAL DATA: Meningioma (HCC) 2S0.Y (XQT-LD-CM).

EXAM:
MRI HEAD WITHOUT AND WITH CONTRAST
TECHNIQUE: Multiplanar, multiecho pulse sequences of the brain and surrounding
structures were obtained without and with intravenous contrast.
CONTRAST:  6mL GADAVIST GADOBUTROL 1 MMOL/ML IV SOLN

[Series 5: ax dwi_tracew · axial · 3.0mm · 0.65mm/px · z∈[-68,+81]mm · 2 of 48 slices shown]
[im 1/48]
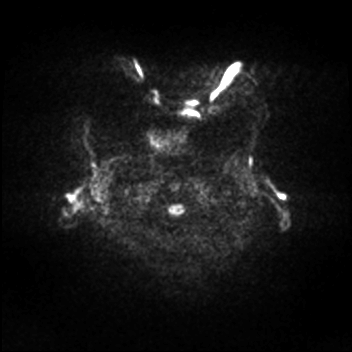
[im 48/48]
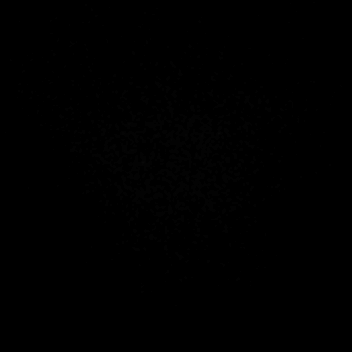

[Series 6: ax dwi_adc · axial · 3.0mm · 0.65mm/px · z∈[-68,+78]mm · 3 of 47 slices shown]
[im 1/47]
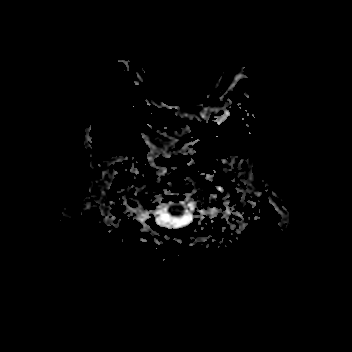
[im 24/47]
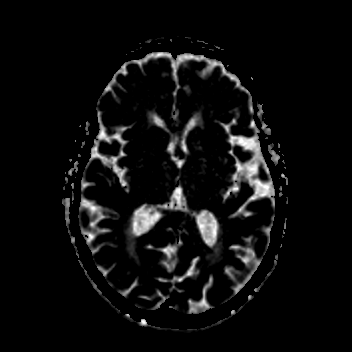
[im 47/47]
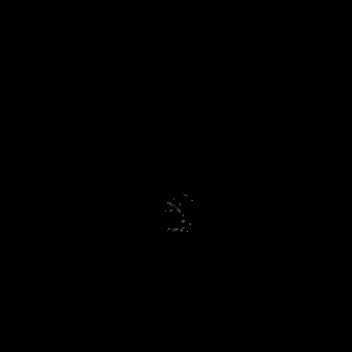

[Series 7: cor dwi_tracew · coronal · 5.0mm · 0.65mm/px · 2 of 40 slices shown]
[im 1/40]
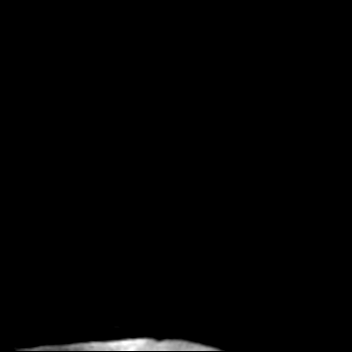
[im 40/40]
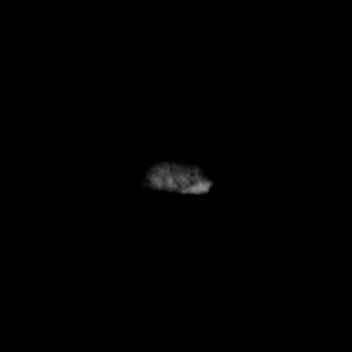

[Series 8: cor dwi_adc · coronal · 5.0mm · 0.65mm/px · 2 of 40 slices shown]
[im 1/40]
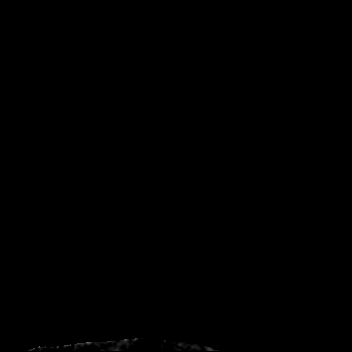
[im 40/40]
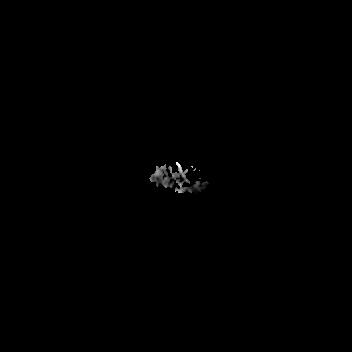

[Series 9: T1 · sagittal · 5.0mm · 0.62mm/px · 1 of 21 slices shown (1 of 2)]
[im 1/21]
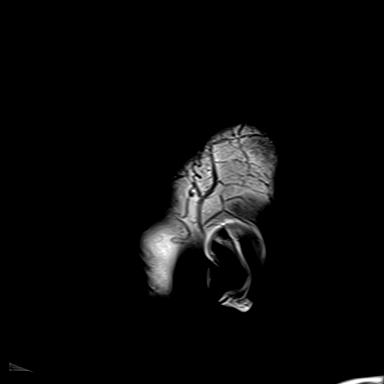

[Series 10: T2 · axial · 5.0mm · 0.53mm/px · 1 of 25 slices shown]
[im 1/25]
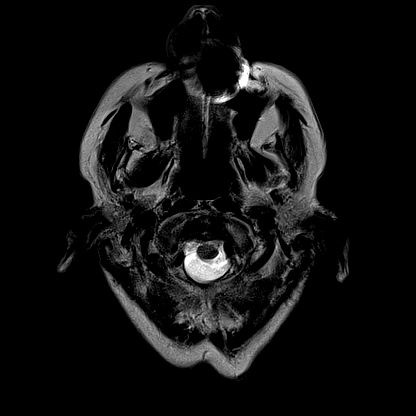

[Series 11: mag_images · axial · 3.0mm · 0.90mm/px · z∈[-78,+93]mm · 3 of 60 slices shown]
[im 1/60]
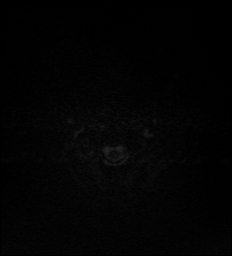
[im 30/60]
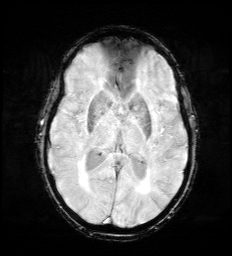
[im 60/60]
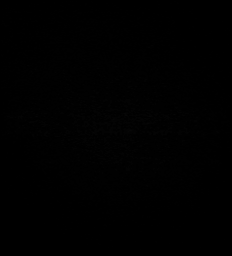

[Series 12: pha_images · axial · 3.0mm · 0.90mm/px · z∈[-78,+90]mm · 3 of 59 slices shown]
[im 1/59]
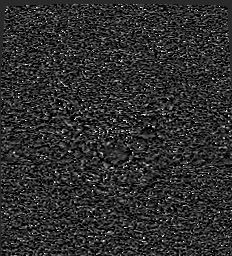
[im 30/59]
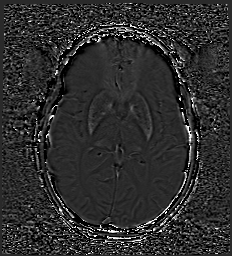
[im 59/59]
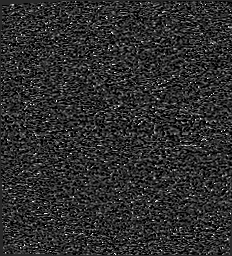

[Series 13: swi_images · axial · 3.0mm · 0.90mm/px · z∈[-78,+93]mm · 3 of 60 slices shown]
[im 1/60]
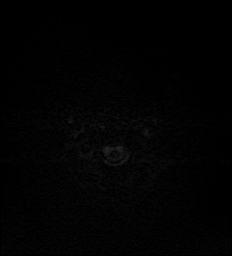
[im 30/60]
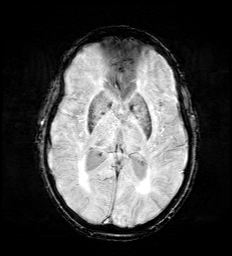
[im 60/60]
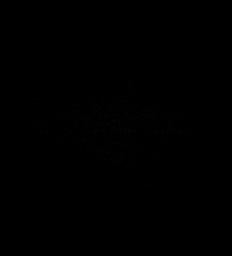

[Series 15: FLAIR · axial · 3.0mm · 0.53mm/px · z∈[-72,+84]mm · 3 of 55 slices shown]
[im 1/55]
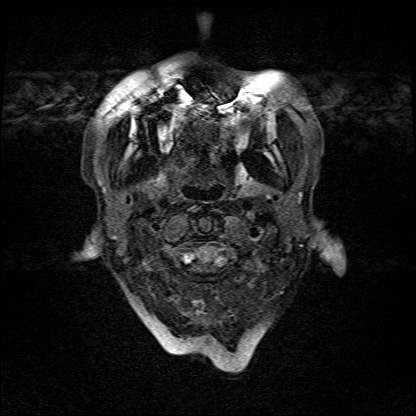
[im 28/55]
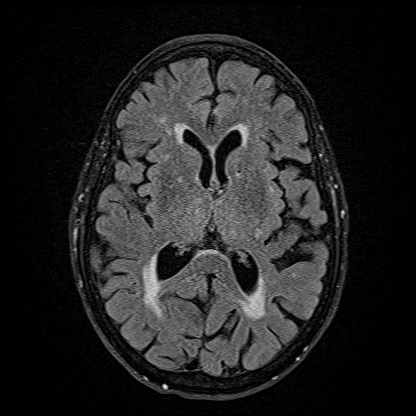
[im 55/55]
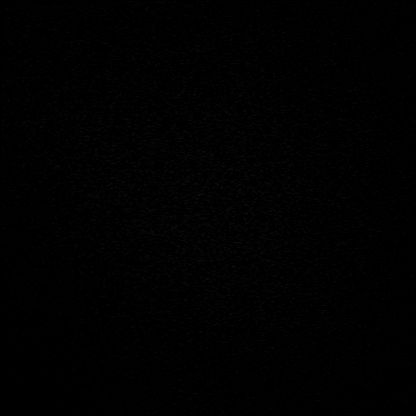

[Series 16: T1 · axial · 1.0mm · 0.98mm/px · z∈[-75,+93]mm · 10 of 176 slices shown (2 of 2)]
[im 1/176]
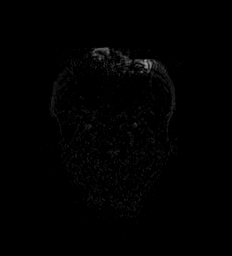
[im 20/176]
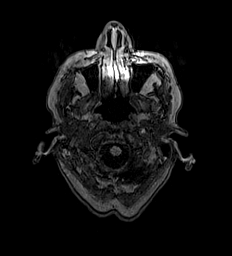
[im 39/176]
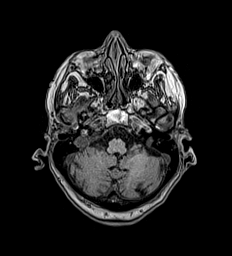
[im 59/176]
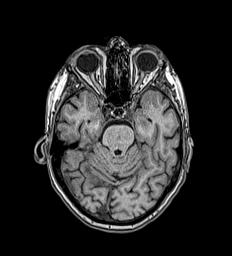
[im 78/176]
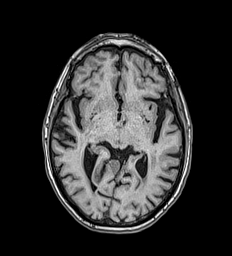
[im 98/176]
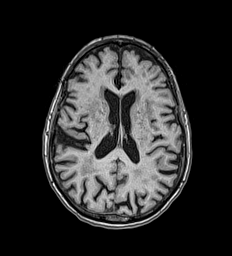
[im 117/176]
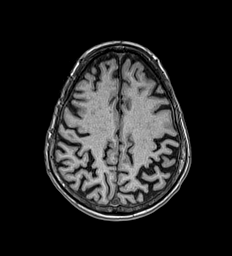
[im 137/176]
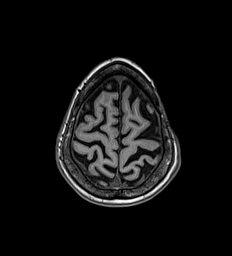
[im 156/176]
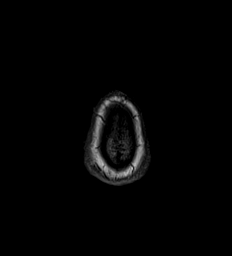
[im 176/176]
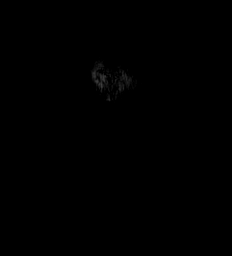

[Series 17: T2 post-contrast · coronal · 5.0mm · 0.57mm/px · 2 of 29 slices shown]
[im 1/29]
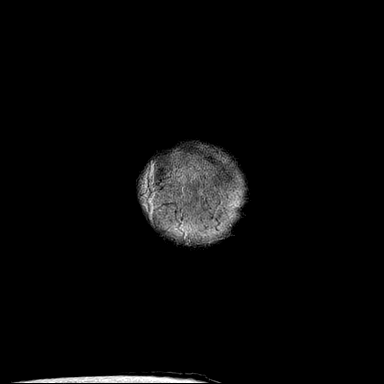
[im 29/29]
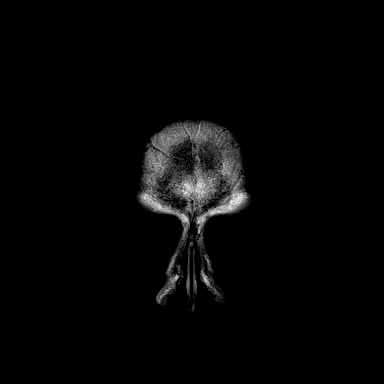

[Series 18: T1 post-contrast · axial · 1.0mm · 0.98mm/px · z∈[-75,+93]mm · 10 of 176 slices shown (1 of 3)]
[im 1/176]
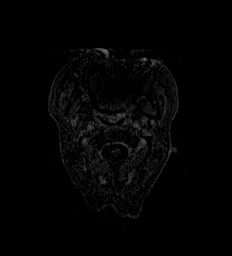
[im 20/176]
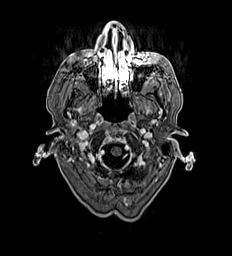
[im 39/176]
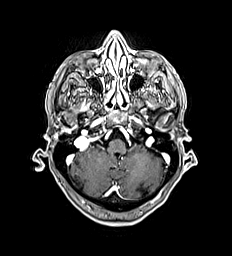
[im 59/176]
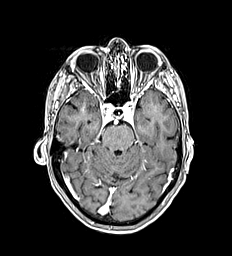
[im 78/176]
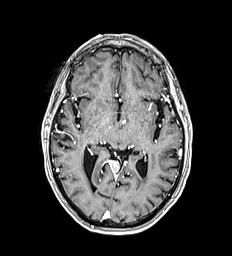
[im 98/176]
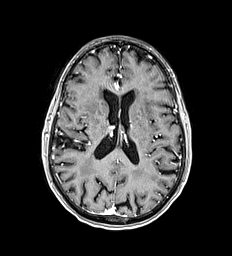
[im 117/176]
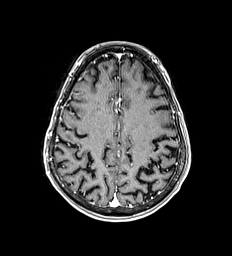
[im 137/176]
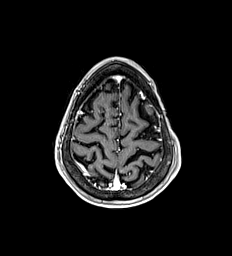
[im 156/176]
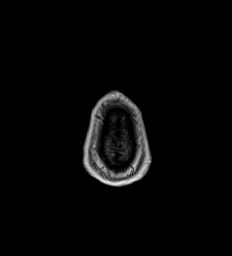
[im 176/176]
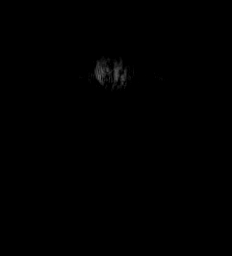

[Series 19: T1 post-contrast · coronal · 5.0mm · 0.57mm/px · 2 of 29 slices shown (2 of 3)]
[im 1/29]
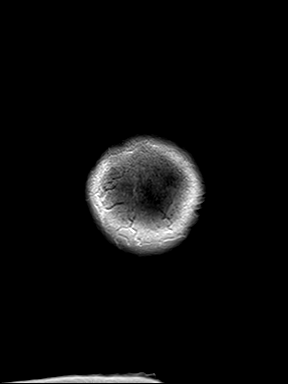
[im 29/29]
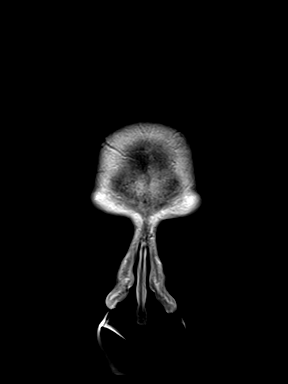

[Series 20: T1 post-contrast · sagittal · 5.0mm · 0.62mm/px · 1 of 21 slices shown (3 of 3)]
[im 1/21]
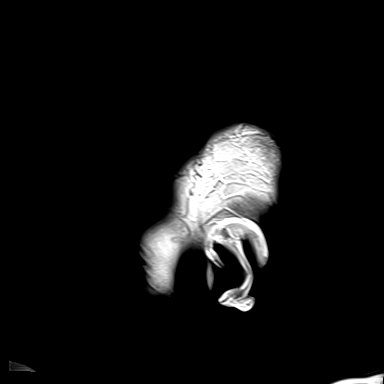

[48 of 48 positions shown; findings below may reference images not displayed]

FINDINGS: Brain:

Mild generalized cerebral and cerebellar atrophy.

Moderate multifocal T2/FLAIR hyperintensity within the cerebral
white matter, nonspecific but compatible with chronic small vessel
ischemic disease. Mild chronic small-vessel ischemic changes are
also present within the pons.

Unchanged size of a homogeneously enhancing dural-based mass along
the undersurface of the right tentorium, near the incisura,
measuring 17 x 9 mm (series 19, image 10) (remeasured on prior). As
before, there is minimal mass effect upon the underlying cerebellum
without underlying parenchymal edema.

Small chronic lacunar infarcts within the bilateral cerebellar
hemispheres, unchanged.

There is no acute infarct.

No extra-axial fluid collection.

No midline shift.

Vascular: Maintained flow voids within the proximal large arterial
vessels.

Skull and upper cervical spine: No focal marrow lesion.

Sinuses/Orbits: Visualized orbits show no acute finding. Bilateral
lens replacements. Mild mucosal thickening versus small mucous
retention cyst within a posterior left ethmoid air cell.

Other: Small left mastoid effusion.
IMPRESSION: 14 x 9 mm homogeneously enhancing dural-based mass along the
undersurface of the right tentorium, near the incisura, unchanged in
size from the brain MRI of 11/29/2019. The imaging features are most
compatible with meningioma. As before, there is minimal mass effect
upon the underlying cerebellum without underlying parenchymal edema.

Stable chronic small-vessel ischemic changes which are moderate in
the cerebral white matter, and mild in the pons.

Redemonstrated small chronic lacunar infarcts within the bilateral
cerebellar hemispheres.

Mild generalized parenchymal atrophy.

Small left mastoid effusion.

## 2022-05-26 ENCOUNTER — Ambulatory Visit: Payer: Medicare Other

## 2022-05-27 ENCOUNTER — Ambulatory Visit (LOCAL_COMMUNITY_HEALTH_CENTER): Payer: Medicare Other

## 2022-05-27 DIAGNOSIS — Z23 Encounter for immunization: Secondary | ICD-10-CM | POA: Diagnosis not present

## 2022-05-27 DIAGNOSIS — Z719 Counseling, unspecified: Secondary | ICD-10-CM

## 2022-05-27 NOTE — Progress Notes (Signed)
Pt seen in clinic for [employer] vaccine. Eligible, administered  [place] 14y+, yr 2023-2024, monitored for 15 min without any problems. Given VIS and NCIR copy. M.Talaysha Freeberg, LPN.

## 2023-02-14 ENCOUNTER — Ambulatory Visit
Admission: RE | Admit: 2023-02-14 | Discharge: 2023-02-14 | Disposition: A | Discharge status: Home / Self Care | Payer: Medicare Other | Attending: Internal Medicine | Admitting: Internal Medicine

## 2023-02-14 ENCOUNTER — Ambulatory Visit
Admission: RE | Admit: 2023-02-14 | Discharge: 2023-02-14 | Disposition: A | Discharge status: Home / Self Care | Payer: Medicare Other | Source: Ambulatory Visit | Attending: Internal Medicine | Admitting: Internal Medicine

## 2023-02-14 ENCOUNTER — Other Ambulatory Visit: Payer: Self-pay | Admitting: Internal Medicine

## 2023-02-14 DIAGNOSIS — R059 Cough, unspecified: Secondary | ICD-10-CM | POA: Diagnosis present

## 2023-03-08 ENCOUNTER — Other Ambulatory Visit: Payer: Self-pay | Admitting: Internal Medicine

## 2023-03-08 DIAGNOSIS — R634 Abnormal weight loss: Secondary | ICD-10-CM

## 2023-03-08 DIAGNOSIS — R109 Unspecified abdominal pain: Secondary | ICD-10-CM

## 2023-03-09 ENCOUNTER — Ambulatory Visit
Admission: RE | Admit: 2023-03-09 | Discharge: 2023-03-09 | Disposition: A | Discharge status: Home / Self Care | Payer: Medicare Other | Source: Ambulatory Visit | Attending: Internal Medicine | Admitting: Internal Medicine

## 2023-03-09 DIAGNOSIS — R634 Abnormal weight loss: Secondary | ICD-10-CM | POA: Diagnosis present

## 2023-03-09 DIAGNOSIS — R109 Unspecified abdominal pain: Secondary | ICD-10-CM | POA: Diagnosis present

## 2023-10-31 ENCOUNTER — Ambulatory Visit
